# Patient Record
Sex: Male | Born: 1965
Health system: Southern US, Community
[De-identification: ages and names within clinical notes are randomized; demographics above are authoritative.]

## PROBLEM LIST (undated history)

## (undated) DIAGNOSIS — R7989 Other specified abnormal findings of blood chemistry: Secondary | ICD-10-CM

## (undated) DIAGNOSIS — K5792 Diverticulitis of intestine, part unspecified, without perforation or abscess without bleeding: Secondary | ICD-10-CM

---

## 1998-09-12 ENCOUNTER — Ambulatory Visit (HOSPITAL_BASED_OUTPATIENT_CLINIC_OR_DEPARTMENT_OTHER): Admission: RE | Admit: 1998-09-12 | Discharge: 1998-09-12 | Payer: Self-pay | Admitting: Surgery

## 1999-10-30 ENCOUNTER — Emergency Department (HOSPITAL_COMMUNITY): Admission: EM | Admit: 1999-10-30 | Discharge: 1999-10-30 | Payer: Self-pay | Admitting: *Deleted

## 1999-10-30 ENCOUNTER — Encounter: Payer: Self-pay | Admitting: Gastroenterology

## 2003-01-04 ENCOUNTER — Ambulatory Visit (HOSPITAL_COMMUNITY): Admission: RE | Admit: 2003-01-04 | Discharge: 2003-01-04 | Payer: Self-pay | Admitting: Internal Medicine

## 2003-01-04 ENCOUNTER — Encounter: Payer: Self-pay | Admitting: Internal Medicine

## 2004-04-27 ENCOUNTER — Encounter (HOSPITAL_COMMUNITY): Admission: RE | Admit: 2004-04-27 | Discharge: 2004-05-27 | Payer: Self-pay | Admitting: Internal Medicine

## 2005-11-21 ENCOUNTER — Ambulatory Visit: Payer: Self-pay | Admitting: Orthopedic Surgery

## 2005-11-22 ENCOUNTER — Encounter: Payer: Self-pay | Admitting: Orthopedic Surgery

## 2006-02-23 ENCOUNTER — Observation Stay (HOSPITAL_COMMUNITY): Admission: EM | Admit: 2006-02-23 | Discharge: 2006-02-25 | Payer: Self-pay | Admitting: Emergency Medicine

## 2006-10-30 ENCOUNTER — Ambulatory Visit: Payer: Self-pay | Admitting: Orthopedic Surgery

## 2006-12-04 ENCOUNTER — Ambulatory Visit: Payer: Self-pay | Admitting: Orthopedic Surgery

## 2007-01-21 ENCOUNTER — Ambulatory Visit (HOSPITAL_COMMUNITY): Admission: RE | Admit: 2007-01-21 | Discharge: 2007-01-21 | Payer: Self-pay | Admitting: Internal Medicine

## 2007-03-30 ENCOUNTER — Ambulatory Visit: Payer: Self-pay | Admitting: Orthopedic Surgery

## 2007-08-27 ENCOUNTER — Ambulatory Visit: Payer: Self-pay | Admitting: Orthopedic Surgery

## 2007-08-27 DIAGNOSIS — M25549 Pain in joints of unspecified hand: Secondary | ICD-10-CM

## 2007-08-31 ENCOUNTER — Encounter: Payer: Self-pay | Admitting: Orthopedic Surgery

## 2007-09-01 ENCOUNTER — Telehealth: Payer: Self-pay | Admitting: Orthopedic Surgery

## 2008-10-19 ENCOUNTER — Inpatient Hospital Stay (HOSPITAL_COMMUNITY): Admission: EM | Admit: 2008-10-19 | Discharge: 2008-10-20 | Payer: Self-pay | Admitting: Emergency Medicine

## 2008-11-02 ENCOUNTER — Ambulatory Visit (HOSPITAL_COMMUNITY): Admission: RE | Admit: 2008-11-02 | Discharge: 2008-11-02 | Payer: Self-pay | Admitting: Internal Medicine

## 2009-03-14 ENCOUNTER — Ambulatory Visit (HOSPITAL_COMMUNITY): Admission: RE | Admit: 2009-03-14 | Discharge: 2009-03-14 | Payer: Self-pay | Admitting: General Surgery

## 2010-09-25 LAB — COMPREHENSIVE METABOLIC PANEL
ALT: 20 U/L (ref 0–53)
AST: 22 U/L (ref 0–37)
Albumin: 4.2 g/dL (ref 3.5–5.2)
Alkaline Phosphatase: 58 U/L (ref 39–117)
Potassium: 3.6 mEq/L (ref 3.5–5.1)
Sodium: 137 mEq/L (ref 135–145)
Total Protein: 6.9 g/dL (ref 6.0–8.3)

## 2010-09-25 LAB — URINALYSIS, ROUTINE W REFLEX MICROSCOPIC
Hgb urine dipstick: NEGATIVE
Protein, ur: NEGATIVE mg/dL
Specific Gravity, Urine: 1.005 (ref 1.005–1.030)
Urobilinogen, UA: 0.2 mg/dL (ref 0.0–1.0)

## 2010-09-25 LAB — CBC
Hemoglobin: 15 g/dL (ref 13.0–17.0)
Platelets: 111 10*3/uL — ABNORMAL LOW (ref 150–400)
RDW: 13.4 % (ref 11.5–15.5)

## 2010-09-25 LAB — DIFFERENTIAL
Basophils Relative: 0 % (ref 0–1)
Eosinophils Absolute: 0 10*3/uL (ref 0.0–0.7)
Lymphs Abs: 0.5 10*3/uL — ABNORMAL LOW (ref 0.7–4.0)
Monocytes Absolute: 0.6 10*3/uL (ref 0.1–1.0)
Monocytes Relative: 7 % (ref 3–12)

## 2010-10-30 NOTE — H&P (Signed)
[name], [name] NO.:  [id number]   MEDICAL RECORD NO.:  [id number]          PATIENT TYPE:  INP   LOCATION:  A319                          FACILITY:  [facility]   PHYSICIAN:  Kingsley Callander. Ouida Sills, MD       DATE OF BIRTH:  January 16, 1966   DATE OF ADMISSION:  DATE OF DISCHARGE:  LH                              HISTORY & PHYSICAL   CHIEF COMPLAINT:  Abdominal pain.   HISTORY OF PRESENT ILLNESS:  This patient is a 45 year old male who  presented to the emergency room after developing left lower abdominal  pain on the morning of admission.  He felt he may have had a gas buildup  and also may have been experiencing some symptoms of abdominal wall  strain after a vigorous physical workout the day before.  He had a small  bowel movement on the morning of admission.  He had not experienced  bleeding.  He had not vomited prior to presentation, but after he got to  the hospital, he did have an episode of nausea and vomiting.  He denied  fever.  The pain seemed to radiate into his flank.  He was evaluated and  treated in the emergency room.  He had a CT scan, which revealed  diverticulitis with a contained perforation.   PAST MEDICAL HISTORY:  1. Gastroenteritis with a possible Mallory-Weiss tear at the time of a      hospitalization in 2007.  2. Recent hip discomfort treated with intermittent indomethacin use      prescribed by Dr. Romeo Apple.  3. Insomnia.   MEDICATIONS:  1. Restoril 15 mg nightly p.r.n.  2. Indomethacin 50 mg p.r.n., he has taken approximately two doses in      the last week.  3. Fish oil.  4. MSM.   ALLERGIES:  None.   SOCIAL HISTORY:  He does not smoke.  He does not abuse alcohol.  He does  not use recreational drugs.   FAMILY HISTORY:  His mother required surgery for diverticulitis last  year.   REVIEW OF SYSTEMS:  Otherwise noncontributory.   PHYSICAL EXAMINATION:  VITAL SIGNS:  Temperature 98.4, pulse 76,  respirations 24, blood pressure 139/77.  GENERAL:  Alert and in no distress.  HEENT:  No scleral icterus.  Pharynx is normal.  NECK:  Supple with no JVD or thyromegaly.  LUNGS:  Clear.  HEART:  Regular with no murmurs.  ABDOMEN:  Nondistended, tender in the left lower quadrant.  No palpable  mass or hepatosplenomegaly.  No rebound, tenderness, or guarding.  EXTREMITIES:  Normal pulses.  No cyanosis, clubbing, or edema.  NEUROLOGIC:  Fully intact.  LYMPH NODES:  No enlargement.   LABORATORY DATA:  White count 8.4, hemoglobin 15, platelets 111,000, 87  neutrophils, 6 lymphocytes.  Sodium 137, potassium 3.6, bicarb 29,  glucose 105, BUN 10, creatinine 1, SGOT 22, albumin 4.2, calcium 9.3.  Urinalysis is negative.  The CT scan of the abdomen and pelvis reveals  diffuse inflammatory changes of the descending proximal sigmoid colon  compatible with diverticulitis.  There was focal extracolonic gas  collection compatible with a contained perforation.  There was diffuse  inflammatory change including free fluid layering in the left pericolic  gutter.   IMPRESSION:  1. Diverticulitis with contained perforation.  He has been started on      and will continue IV ciprofloxacin and metronidazole.  Intravenous      Dilaudid will be used for pain control.  He has been seen in      surgical consultation by Dr. Lovell Sheehan.  He does not appear to      require surgical intervention at this point.  His case has been      discussed with Dr. Lovell Sheehan.  2. Thrombocytopenia.  3. History of insomnia.      Kingsley Callander. Ouida Sills, MD  Electronically Signed     ROF/MEDQ  D:  10/19/2008  T:  10/20/2008  Job:  [id number]

## 2010-10-30 NOTE — Discharge Summary (Signed)
NAMEBRYLEN, [name] NO.:  [id number]   MEDICAL RECORD NO.:  [id number]          PATIENT TYPE:  INP   LOCATION:  A319                          FACILITY:  [facility]   PHYSICIAN:  Kingsley Callander. Ouida Sills, MD       DATE OF BIRTH:  07/19/1965   DATE OF ADMISSION:  10/18/2008  DATE OF DISCHARGE:  05/06/2010LH                               DISCHARGE SUMMARY   DISCHARGE DIAGNOSES:  1. Diverticulitis with a contained perforation.  2. Thrombocytopenia.  3. Insomnia.   HOSPITAL COURSE:  This patient is a 45 year old male who presented with  left lower quadrant pain.  His white count was 8.4.  His temperature was  98.4.  He was evaluated with a CT scan, which revealed diverticulitis  with a contained perforation.  He was treated with IV ciprofloxacin and  metronidazole.  He was seen in surgical consultation by Dr. Lovell Sheehan.  He  was not felt to require operative intervention at this point.   He remained afebrile.  He was able to eat.  He was able to have a bowel  movement.  He was discharged on the second hospital day and will have  follow up in my office in 1 week.   DISCHARGE MEDICATIONS:  1. Cipro 500 mg b.i.d. for 14 days.  2. Metronidazole 500 mg t.i.d. for 14 days.  3. Vicodin q.4 h. p.r.n.  4. Restoril 15 mg nightly p.r.n.      Kingsley Callander. Ouida Sills, MD  Electronically Signed     ROF/MEDQ  D:  10/24/2008  T:  10/24/2008  Job:  [id number]

## 2010-11-02 NOTE — Discharge Summary (Signed)
[name], CAPPELLA NO.:  [id number]   MEDICAL RECORD NO.:  [id number]          PATIENT TYPE:  OBV   LOCATION:  A213                          FACILITY:  [facility]   PHYSICIAN:  Osvaldo Shipper, MD     DATE OF BIRTH:  18-Oct-1965   DATE OF ADMISSION:  02/23/2006  DATE OF DISCHARGE:  09/11/2007LH                                 DISCHARGE SUMMARY   PRIMARY CARE PHYSICIAN:  Kingsley Callander. Ouida Sills, MD.   Please review H&P dictated at the time of admission for details regarding  patient's presenting illness.   DISCHARGE DIAGNOSES:  1. Acute gastroenteritis from food poisoning, improved.  2. Thrombocytopenia and leukopenia of unclear etiology.   BRIEF HOSPITAL COURSE:  Briefly, this is a 45 year old Caucasian male who  presented to the ED two days ago with complaints of severe vomiting and  abdominal pain followed by diarrhea.  Apparently patient had eaten a  pokeweek salad on the day of his symptoms and it was thought that most  likely patient had presentations  consistent with food poisoning.  Patient  was admitted to the hospital for further management.  Patient also reported  some pinkish material in his emesis as well as in his bowel movements.  Possible patient might have suffered Mallory-Weiss tear because of his  severe vomiting.  In any case, his hemoglobins were monitored.  Initially  his hemoglobin actually was 17.4 which came down to 13.8.  His  high  hemoglobin is likely secondary to dehydration.  However, on the day of  discharge, he did not have any episodes of bleeding per rectum or orally.  His symptoms slowly improved over the course of the next two days.  He was  also gently hydrated.  On the day of discharge, he was completely  asymptomatic.  His diarrhea also  had improved and he was considered stable  for discharge.  Patient also showed a willingness to go home.   During the course of this admission, patient was noted to have low platelet  count in the 100  to 130s range.  He was also found to have a white count of  2.6 and 3.2.  He also was found to have somewhat of a low lymphocyte and  neutrophil count.  Patient informed that he was on Valtrex for genital  herpes. Valtrex can cause neutropenia as well as thrombocytopenia, hence  this is likely the reason for these findings.  He informed me that he has  been tested for HIV recently and was negative.  I have asked him to repeat a  CBC in a week's time and then follow up  with his PMD if these findings  persist.  He may need to be referred to a hematologist if his neutropenia  and thrombocytopenia do not resolve.   DISCHARGE MEDICATIONS:  Phenergan 25 mg p.o. q.4-6h. p.r.n. nausea.  Otherwise patient informed me that he does not take any other medications  apart from the Valtrex and Allegra.   FOLLOW UP:  1. Dr. Ouida Sills in one to two weeks.  2. CBC in one week.  DIET:  Patient has been asked to take a full liquid diet for today and then  a bland diet for one day and then he may return to his regular diet if he  feels well.   OTHER INSTRUCTIONS:  He was told that if his symptoms recur, he needs to  seek attention immediately.   Time spent discharging the patient:      Osvaldo Shipper, MD  Electronically Signed     GK/MEDQ  D:  02/25/2006  T:  02/25/2006  Job:  [id number]   cc:   Kingsley Callander. Ouida Sills, MD  Fax: [phone]

## 2010-11-02 NOTE — Procedures (Signed)
NAMEVAIL, VUNCANNON NO.:  [id number]   MEDICAL RECORD NO.:  [id number]          PATIENT TYPE:  REC   LOCATION:                                FACILITY:  [facility]   PHYSICIAN:  Kingsley Callander. Ouida Sills, MD       DATE OF BIRTH:  [birth year]   DATE OF PROCEDURE:  04/27/2004  DATE OF DISCHARGE:                                    STRESS TEST   DESCRIPTION OF PROCEDURE:  Gwenevere Abbot underwent an exercise stress test to  evaluate symptoms of recent chest pain.  He exercised 12 minutes (completed  stage IV of the Bruce protocol) maintaining a maximum heart rate of 175 (97%  or the age-predicted maximum heart rate) at a work load of 12.8 mets and  discontinued exercise due to fatigue.  There were no symptoms of chest pain.  There were no arrhythmias.  There were no ST segment changes diagnostic of  ischemia.  His baseline electrocardiogram revealed sinus bradycardia at 57  beats per minute with poor R-wave progression.   IMPRESSION:  No evidence of exercise-induced ischemia.     Channing Mutters   ROF/MEDQ  D:  04/27/2004  T:  04/27/2004  Job:  [id number]

## 2010-11-02 NOTE — Group Therapy Note (Signed)
NAMEDEXTER, SIGNOR NO.:  [id number]   MEDICAL RECORD NO.:  [id number]          PATIENT TYPE:  OBV   LOCATION:  A213                          FACILITY:  [facility]   PHYSICIAN:  Margaretmary Dys, M.D.DATE OF BIRTH:  1966/03/08   DATE OF PROCEDURE:  02/24/2006  DATE OF DISCHARGE:                                   PROGRESS NOTE   SUBJECTIVE:  The patient was admitted yesterday with nausea and vomiting  secondary to poke salad poisoning. The patient continues to have some nausea  but no more vomiting. He has some diarrhea with crampy abdominal pain. He  has had about 4 or 5 episodes this morning. He denies any fevers or chills.   He does have some muscle aches too.   OBJECTIVE:  GENERAL:  Conscious, alert, comfort not in acute distress. The  patient is acutely ill looking.  VITAL SIGNS:  Blood pressure was 123/77, pulse of 80, respirations 22. Tmax  was 99 degrees Fahrenheit.  HEENT:  Normocephalic, atraumatic. Oral mucosa was dry. No exudates were  noted.  NECK:  Supple, no JVD or lymphadenopathy.  LUNGS:  Good air entry bilaterally.  HEART:  S1, S2 regular. No S3, S4, gallops or rubs.  ABDOMEN:  Soft, nontender, bowel sounds positive, no masses palpable.  EXTREMITIES:  No pitting pedal edema. No calf induration or tenderness was  noted.   LABORATORY/DIAGNOSTIC DATA:  White blood cell count is down to 2.6,  hemoglobin of 16.5, hematocrit 48.1, platelet count 121. Lymphocytes were  4%, neutrophils 77%. Sodium 137, potassium was 3.9, chloride of 100. CO2 23,  glucose 141, BUN of 19, creatinine was 1.1. Calcium is 8.4.   Mr. Ron is a 45 year old Caucasian male who ate poke salad yesterday.  Poke is a well known toxin which is fairly common in the [place]. The patient  is having symptoms of nausea, vomiting, abdominal pain and diarrhea. The  patient has remained hemodynamically stable with no evidence of sepsis. His  kidney function has also remained intact. I did  a PubMed search on poke  poisoning and did not really come up with much but the fact that it does  contain some histamine products and the patient may be having all this  diarrhea and these symptoms because of elevated histamine action.   PLAN:  Continue IV hydration. Will give him Phenergan as needed. I will  start him on antihistamines if his symptoms continue to worsen. Will  continue to monitor his kidney function. His initial coagulation  profile  was  unremarkable but I did not see anything that would suggest that the poison  from the plant may cause bleeding abnormalities. Will continue to monitor  him closely though. I have discussed the overall plan with him and he has  verbalized a full understanding.      Margaretmary Dys, M.D.  Electronically Signed     AM/MEDQ  D:  02/24/2006  T:  02/24/2006  Job:  [id number]

## 2011-10-14 ENCOUNTER — Other Ambulatory Visit: Payer: Self-pay | Admitting: Orthopedic Surgery

## 2011-10-16 ENCOUNTER — Encounter (HOSPITAL_COMMUNITY): Payer: Self-pay | Admitting: Pharmacy Technician

## 2011-10-17 ENCOUNTER — Encounter (HOSPITAL_COMMUNITY)
Admission: RE | Admit: 2011-10-17 | Discharge: 2011-10-17 | Disposition: A | Discharge status: Home / Self Care | Payer: BC Managed Care – PPO | Source: Ambulatory Visit | Attending: Orthopedic Surgery | Admitting: Orthopedic Surgery

## 2011-10-17 ENCOUNTER — Encounter (HOSPITAL_COMMUNITY): Payer: Self-pay

## 2011-10-17 DIAGNOSIS — R7989 Other specified abnormal findings of blood chemistry: Secondary | ICD-10-CM

## 2011-10-17 HISTORY — DX: Other specified abnormal findings of blood chemistry: R79.89

## 2011-10-17 HISTORY — PX: HERNIA REPAIR: SHX51

## 2011-10-17 HISTORY — PX: WISDOM TOOTH EXTRACTION: SHX21

## 2011-10-17 LAB — SURGICAL PCR SCREEN: Staphylococcus aureus: NEGATIVE

## 2011-10-17 NOTE — Pre-Procedure Instructions (Addendum)
10-17-11 No labs or diagnostic  testing required today. 10-21-11 1210 -Dr. Simonne Come states no antibiotic needed preop. [name]

## 2011-10-17 NOTE — Patient Instructions (Signed)
[address] Haw[address] Michel Aguila  10/17/2011   Your procedure is scheduled on:  5-7 -2013  Report to [facility] at   1:00 PM.  Call this number if you have problems the morning of surgery: [phone]   Remember:   Do not eat food:After Midnight.  May have clear liquids:up to 6 Hours before arrival. Nothing after : 0900  Clear liquids include soda, tea, black coffee, apple or grape juice, broth.  Take these medicines the morning of surgery with A SIP OF WATER: none   Do not wear jewelry, make-up or nail polish.  Do not wear lotions, powders, or perfumes. You may wear deodorant.  Do not shave 48 hours prior to surgery.(face and neck okay, no shaving of legs)  Do not bring valuables to the hospital.  Contacts, dentures or bridgework may not be worn into surgery.  Leave suitcase in the car. After surgery it may be brought to your room.  For patients admitted to the hospital, checkout time is 11:00 AM the day of discharge.   Patients discharged the day of surgery will not be allowed to drive home.  Name and phone number of your driver: spouse  Special Instructions: CHG Shower Use Special Wash: 1/2 bottle night before surgery and 1/2 bottle morning of surgery.(avoid face and genitals)   Please read over the following fact sheets that you were given: MRSA Information.

## 2011-10-22 ENCOUNTER — Encounter (HOSPITAL_COMMUNITY)
Admission: RE | Disposition: A | Discharge status: Home / Self Care | Payer: Self-pay | Source: Ambulatory Visit | Attending: Orthopedic Surgery

## 2011-10-22 ENCOUNTER — Ambulatory Visit (HOSPITAL_COMMUNITY): Payer: BC Managed Care – PPO | Admitting: Anesthesiology

## 2011-10-22 ENCOUNTER — Observation Stay (HOSPITAL_COMMUNITY)
Admission: RE | Admit: 2011-10-22 | Discharge: 2011-10-23 | Disposition: A | Discharge status: Home / Self Care | Payer: BC Managed Care – PPO | Source: Ambulatory Visit | Attending: Orthopedic Surgery | Admitting: Orthopedic Surgery

## 2011-10-22 ENCOUNTER — Encounter (HOSPITAL_COMMUNITY): Payer: Self-pay | Admitting: Anesthesiology

## 2011-10-22 ENCOUNTER — Encounter (HOSPITAL_COMMUNITY): Payer: Self-pay | Admitting: *Deleted

## 2011-10-22 DIAGNOSIS — M2010 Hallux valgus (acquired), unspecified foot: Secondary | ICD-10-CM | POA: Insufficient documentation

## 2011-10-22 DIAGNOSIS — M898X9 Other specified disorders of bone, unspecified site: Principal | ICD-10-CM | POA: Insufficient documentation

## 2011-10-22 DIAGNOSIS — M25549 Pain in joints of unspecified hand: Secondary | ICD-10-CM

## 2011-10-22 DIAGNOSIS — M21619 Bunion of unspecified foot: Secondary | ICD-10-CM | POA: Insufficient documentation

## 2011-10-22 DIAGNOSIS — Z01812 Encounter for preprocedural laboratory examination: Secondary | ICD-10-CM | POA: Insufficient documentation

## 2011-10-22 DIAGNOSIS — Q66219 Congenital metatarsus primus varus, unspecified foot: Secondary | ICD-10-CM | POA: Insufficient documentation

## 2011-10-22 HISTORY — PX: BUNIONECTOMY: SHX129

## 2011-10-22 HISTORY — PX: MASS EXCISION: SHX2000

## 2011-10-22 SURGERY — EXCISION MASS
Anesthesia: General | Site: Foot | Laterality: Right | Wound class: Clean

## 2011-10-22 MED ORDER — MORPHINE SULFATE 2 MG/ML IJ SOLN
1.0000 mg | INTRAMUSCULAR | Status: DC | PRN
  Administered 2011-10-22 – 2011-10-23 (×3): 1 mg via INTRAVENOUS
  Filled 2011-10-22 (×2): qty 1

## 2011-10-22 MED ORDER — OXYCODONE-ACETAMINOPHEN 5-325 MG PO TABS
1.0000 | ORAL_TABLET | ORAL | Status: DC | PRN
  Administered 2011-10-22 – 2011-10-23 (×4): 2 via ORAL
  Filled 2011-10-22 (×4): qty 2

## 2011-10-22 MED ORDER — LACTATED RINGERS IV SOLN
INTRAVENOUS | Status: DC
  Administered 2011-10-22: 1000 mL via INTRAVENOUS

## 2011-10-22 MED ORDER — TEMAZEPAM 15 MG PO CAPS: 15.0000 mg | ORAL_CAPSULE | Freq: Every evening | ORAL | Status: DC | PRN

## 2011-10-22 MED ORDER — LIDOCAINE HCL (CARDIAC) 20 MG/ML IV SOLN
INTRAVENOUS | Status: DC | PRN
  Administered 2011-10-22: 100 mg via INTRAVENOUS

## 2011-10-22 MED ORDER — BUPIVACAINE HCL (PF) 0.5 % IJ SOLN
INTRAMUSCULAR | Status: AC
  Filled 2011-10-22: qty 30

## 2011-10-22 MED ORDER — ACETAMINOPHEN 10 MG/ML IV SOLN
INTRAVENOUS | Status: AC
  Filled 2011-10-22: qty 100

## 2011-10-22 MED ORDER — ONDANSETRON HCL 4 MG/2ML IJ SOLN
INTRAMUSCULAR | Status: DC | PRN
  Administered 2011-10-22: 4 mg via INTRAVENOUS

## 2011-10-22 MED ORDER — ONDANSETRON HCL 4 MG PO TABS: 4.0000 mg | ORAL_TABLET | Freq: Four times a day (QID) | ORAL | Status: DC | PRN

## 2011-10-22 MED ORDER — BUPIVACAINE HCL (PF) 0.5 % IJ SOLN
INTRAMUSCULAR | Status: DC | PRN
  Administered 2011-10-22: 7 mL

## 2011-10-22 MED ORDER — GLYCOPYRROLATE 0.2 MG/ML IJ SOLN
INTRAMUSCULAR | Status: DC | PRN
  Administered 2011-10-22 (×2): 0.1 mg via INTRAVENOUS

## 2011-10-22 MED ORDER — PROPOFOL 10 MG/ML IV EMUL
INTRAVENOUS | Status: DC | PRN
  Administered 2011-10-22: 200 mg via INTRAVENOUS

## 2011-10-22 MED ORDER — HYDROMORPHONE HCL PF 1 MG/ML IJ SOLN
INTRAMUSCULAR | Status: DC | PRN
  Administered 2011-10-22: 0.5 mg via INTRAVENOUS
  Administered 2011-10-22: 1 mg via INTRAVENOUS
  Administered 2011-10-22: 0.5 mg via INTRAVENOUS

## 2011-10-22 MED ORDER — FENTANYL CITRATE 0.05 MG/ML IJ SOLN
INTRAMUSCULAR | Status: DC | PRN
  Administered 2011-10-22 (×3): 50 ug via INTRAVENOUS
  Administered 2011-10-22: 100 ug via INTRAVENOUS

## 2011-10-22 MED ORDER — HYDROMORPHONE HCL PF 1 MG/ML IJ SOLN
INTRAMUSCULAR | Status: AC
  Filled 2011-10-22: qty 1

## 2011-10-22 MED ORDER — LACTATED RINGERS IV SOLN: INTRAVENOUS | Status: DC

## 2011-10-22 MED ORDER — TESTOSTERONE CYPIONATE 100 MG/ML IM SOLN: 100.0000 mg | INTRAMUSCULAR | Status: DC

## 2011-10-22 MED ORDER — MORPHINE SULFATE 2 MG/ML IJ SOLN
INTRAMUSCULAR | Status: AC
  Filled 2011-10-22: qty 1

## 2011-10-22 MED ORDER — PROMETHAZINE HCL 25 MG/ML IJ SOLN: 6.2500 mg | INTRAMUSCULAR | Status: DC | PRN

## 2011-10-22 MED ORDER — POVIDONE-IODINE 7.5 % EX SOLN: Freq: Once | CUTANEOUS | Status: DC

## 2011-10-22 MED ORDER — METOCLOPRAMIDE HCL 10 MG PO TABS: 5.0000 mg | ORAL_TABLET | Freq: Three times a day (TID) | ORAL | Status: DC | PRN

## 2011-10-22 MED ORDER — IBUPROFEN 400 MG PO TABS
400.0000 mg | ORAL_TABLET | Freq: Four times a day (QID) | ORAL | Status: DC | PRN
  Filled 2011-10-22: qty 1

## 2011-10-22 MED ORDER — METOCLOPRAMIDE HCL 5 MG/ML IJ SOLN: 5.0000 mg | Freq: Three times a day (TID) | INTRAMUSCULAR | Status: DC | PRN

## 2011-10-22 MED ORDER — ONDANSETRON HCL 4 MG/2ML IJ SOLN: 4.0000 mg | Freq: Four times a day (QID) | INTRAMUSCULAR | Status: DC | PRN

## 2011-10-22 MED ORDER — ACETAMINOPHEN 10 MG/ML IV SOLN
INTRAVENOUS | Status: DC | PRN
  Administered 2011-10-22: 1000 mg via INTRAVENOUS

## 2011-10-22 MED ORDER — DEXTROSE-NACL 5-0.45 % IV SOLN
INTRAVENOUS | Status: DC
  Administered 2011-10-22 – 2011-10-23 (×2): via INTRAVENOUS

## 2011-10-22 MED ORDER — MIDAZOLAM HCL 5 MG/5ML IJ SOLN
INTRAMUSCULAR | Status: DC | PRN
  Administered 2011-10-22: 2 mg via INTRAVENOUS

## 2011-10-22 MED ORDER — HYDROMORPHONE HCL PF 1 MG/ML IJ SOLN
0.2500 mg | INTRAMUSCULAR | Status: DC | PRN
  Administered 2011-10-22: 0.5 mg via INTRAVENOUS

## 2011-10-22 MED ORDER — MEPERIDINE HCL 50 MG/ML IJ SOLN: 6.2500 mg | INTRAMUSCULAR | Status: DC | PRN

## 2011-10-22 SURGICAL SUPPLY — 53 items
3/0 VICRYL CT-2 NEEDLE IMPLANT
BAG SPEC THK2 15X12 ZIP CLS (MISCELLANEOUS) ×1
BAG ZIPLOCK 12X15 (MISCELLANEOUS) ×2 IMPLANT
BANDAGE CONFORM 3  STR LF (GAUZE/BANDAGES/DRESSINGS) ×2 IMPLANT
BLADE OSCILLATING/SAGITTAL (BLADE) ×2
BLADE SURG 15 STRL LF DISP TIS (BLADE) ×2 IMPLANT
BLADE SURG 15 STRL SS (BLADE) ×6
BLADE SURG SZ10 CARB STEEL (BLADE) ×1 IMPLANT
BLADE SW THK.38XMED LNG THN (BLADE) ×1 IMPLANT
BNDG COHESIVE 3X5 TAN STRL LF (GAUZE/BANDAGES/DRESSINGS) ×2 IMPLANT
CLOTH BEACON ORANGE TIMEOUT ST (SAFETY) ×2 IMPLANT
CUFF TOURN SGL QUICK 34 (TOURNIQUET CUFF) ×2
CUFF TRNQT CYL 34X4X40X1 (TOURNIQUET CUFF) ×1 IMPLANT
DRSG EMULSION OIL 3X3 NADH (GAUZE/BANDAGES/DRESSINGS) ×2 IMPLANT
DURAPREP 26ML APPLICATOR (WOUND CARE) ×2 IMPLANT
ELECT REM PT RETURN 9FT ADLT (ELECTROSURGICAL) ×2
ELECTRODE REM PT RTRN 9FT ADLT (ELECTROSURGICAL) ×1 IMPLANT
GAUZE SPONGE 2X2 8PLY STRL LF (GAUZE/BANDAGES/DRESSINGS) ×1 IMPLANT
GAUZE SPONGE 4X4 16PLY XRAY LF (GAUZE/BANDAGES/DRESSINGS) ×1 IMPLANT
GLOVE BIO SURGEON STRL SZ7.5 (GLOVE) ×2 IMPLANT
GLOVE BIO SURGEON STRL SZ8 (GLOVE) ×4 IMPLANT
GLOVE ECLIPSE 8.0 STRL XLNG CF (GLOVE) ×2 IMPLANT
GLOVE INDICATOR 8.0 STRL GRN (GLOVE) ×4 IMPLANT
K-WIRE CAPS BLUE STERILE .035 (WIRE)
K-WIRE CAPS STERILE WHITE .045 (WIRE) IMPLANT
K-WIRE CAPS STERILE YELLOW .02 (WIRE)
K-WIRE SMTH SNGL TROCAR .028X4 (WIRE)
KIT BASIN OR (CUSTOM PROCEDURE TRAY) ×2 IMPLANT
KWIRE 4.0 X .035IN (WIRE) IMPLANT
KWIRE 4.0 X .045IN (WIRE) IMPLANT
KWIRE 4.0 X .062IN (WIRE) IMPLANT
KWIRE CAPS BLUE STRL .035 (WIRE) IMPLANT
KWIRE CAPS STRL YELLOW .02 (WIRE) IMPLANT
KWIRE SMTH SNGL TROCAR .028X4 (WIRE) IMPLANT
MANIFOLD NEPTUNE II (INSTRUMENTS) ×2 IMPLANT
NS IRRIG 1000ML POUR BTL (IV SOLUTION) ×2 IMPLANT
PACK LOWER EXTREMITY WL (CUSTOM PROCEDURE TRAY) ×2 IMPLANT
PAD CAST 3X4 CTTN HI CHSV (CAST SUPPLIES) IMPLANT
PAD CAST 4YDX4 CTTN HI CHSV (CAST SUPPLIES) ×1 IMPLANT
PADDING CAST ABS 3INX4YD NS (CAST SUPPLIES) ×1
PADDING CAST ABS COTTON 3X4 (CAST SUPPLIES) IMPLANT
PADDING CAST COTTON 3X4 STRL (CAST SUPPLIES) ×2
PADDING CAST COTTON 4X4 STRL (CAST SUPPLIES)
PIN CAPS ORTHO GREEN .062 (PIN) IMPLANT
POSITIONER SURGICAL ARM (MISCELLANEOUS) ×2 IMPLANT
SPONGE GAUZE 2X2 STER 10/PKG (GAUZE/BANDAGES/DRESSINGS)
SPONGE GAUZE 4X4 12PLY (GAUZE/BANDAGES/DRESSINGS) ×2 IMPLANT
SUT BONE WAX W31G (SUTURE) ×1 IMPLANT
SUT ETHILON 4 0 PS 2 18 (SUTURE) ×2 IMPLANT
SUT VIC AB 3-0 CT2 27 (SUTURE) ×1 IMPLANT
SUT VIC AB 4-0 PS1 27 (SUTURE) ×2 IMPLANT
SUT VICRYL 0 27 CT2 27 ABS (SUTURE) ×3 IMPLANT
WATER STERILE IRR 1500ML POUR (IV SOLUTION) ×1 IMPLANT

## 2011-10-22 NOTE — Anesthesia Postprocedure Evaluation (Signed)
  Anesthesia Post-op Note  Patient: [name]  Procedure(s) Performed: Procedure(s) (LRB): EXCISION MASS (Right) BUNIONECTOMY (Right)  Patient Location: PACU  Anesthesia Type: General  Level of Consciousness: awake and alert   Airway and Oxygen Therapy: Patient Spontanous Breathing  Post-op Pain: mild  Post-op Assessment: Post-op Vital signs reviewed, Patient's Cardiovascular Status Stable, Respiratory Function Stable, Patent Airway and No signs of Nausea or vomiting  Post-op Vital Signs: stable  Complications: No apparent anesthesia complications

## 2011-10-22 NOTE — H&P (Signed)
[name] is an 45 y.o. male.   Chief Complaint: painful rt heel and bunion HPIchronic progressive rt heel pain ;hallux valgus with metatarsus primus varus  Past Medical History  Diagnosis Date  . Low serum testosterone level 10-17-11    tx. injections weekly    Past Surgical History  Procedure Date  . Hernia repair 10-17-11    '00- [facility]  . Wisdom tooth extraction 10-17-11    extracted as teenager    History reviewed. No pertinent family history. Social History:  reports that he quit smoking about 11 years ago. He does not have any smokeless tobacco history on file. He reports that he drinks alcohol. He reports that he uses illicit drugs.  Allergies: No Known Allergies  Medications Prior to Admission  Medication Sig Dispense Refill  . ibuprofen (ADVIL,MOTRIN) 200 MG tablet Take 400 mg by mouth every 6 (six) hours as needed. For body aches      . temazepam (RESTORIL) 15 MG capsule Take 15 mg by mouth at bedtime as needed. For sleep      . testosterone cypionate (DEPOTESTOTERONE CYPIONATE) 100 MG/ML injection Inject 100 mg into the muscle every 7 (seven) days. For IM use only        No results found for this or any previous visit (from the past 48 hour(s)). No results found.  ROS  There were no vitals taken for this visit. Physical Exam  Constitutional: He is oriented to person, place, and time. He appears well-developed and well-nourished.  HENT:  Head: Normocephalic and atraumatic.  Right Ear: External ear normal.  Left Ear: External ear normal.  Nose: Nose normal.  Mouth/Throat: Oropharynx is clear and moist.  Eyes: Conjunctivae and EOM are normal. Pupils are equal, round, and reactive to light.  Neck: Normal range of motion. Neck supple.  Cardiovascular: Normal rate, regular rhythm, normal heart sounds and intact distal pulses.   Respiratory: Effort normal and breath sounds normal.  GI: Soft. Bowel sounds are normal.  Musculoskeletal: Normal range of motion.    Neurological: He is alert and oriented to person, place, and time. He has normal reflexes.  Skin: Skin is warm and dry.  Psychiatric: He has a normal mood and affect. His behavior is normal. Judgment and thought content normal.     Assessment/Plan 1.painful rt heel pump bump--excision  2.painful bunion with metatarsus primus varus--for Funk bunionectomy  Algie Westry P 10/22/2011, 2:27 PM

## 2011-10-22 NOTE — Transfer of Care (Signed)
Immediate Anesthesia Transfer of Care Note  Patient: [name]  Procedure(s) Performed: Procedure(s) (LRB): EXCISION MASS (Right) BUNIONECTOMY (Right)  Patient Location: PACU  Anesthesia Type: General  Level of Consciousness: sedated, patient cooperative and responds to stimulaton  Airway & Oxygen Therapy: Patient Spontanous Breathing and Patient connected to face mask oxgen  Post-op Assessment: Report given to PACU RN and Post -op Vital signs reviewed and stable  Post vital signs: Reviewed and stable  Complications: No apparent anesthesia complications

## 2011-10-22 NOTE — Anesthesia Preprocedure Evaluation (Addendum)

## 2011-10-22 NOTE — Brief Op Note (Signed)
10/22/2011  4:54 PM  PATIENT:  [name]  46 y.o. male  PRE-OPERATIVE DIAGNOSIS:  Right Foot Pump Bump with Bunion  And metatarsus  Primus varus  POST-OPERATIVE DIAGNO}SIS: same  PROCEDURE:  Procedure(s) (LRB): EXCISION MASS (pump bump)Right heel Funk bunionectomy right foot  SURGEON:  Surgeon(s) and Role:    * Drucilla Schmidt, MD - Primary  PHYSICIAN ASSISTANT: none  ASSISTANTS: nurse   ANESTHESIA:   general  EBL:  Total I/O In: 1000 [I.V.:1000] Out: -   BLOOD ADMINISTERED:none  DRAINS: none   LOCAL MEDICATIONS USED:  MARCAINE     SPECIMEN:  No Specimen  DISPOSITION OF SPECIMEN:  [id number]  COUNTS:  YES  TOURNIQUET:   Total Tourniquet Time Documented: Thigh (Right) - 70 minutes  DICTATION: .Other Dictation: Dictation Number [id number]  PLAN OF CARE: Admit for overnight observation  PATIENT DISPOSITION:  PACU - hemodynamically stable.   Delay start of Pharmacological VTE agent (>24hrs) due to surgical blood loss or risk of bleeding: not applicable

## 2011-10-23 MED ORDER — OXYCODONE-ACETAMINOPHEN 7.5-325 MG PO TABS: 1.0000 | ORAL_TABLET | ORAL | Status: AC | PRN

## 2011-10-23 MED ORDER — METHOCARBAMOL 500 MG PO TABS: 500.0000 mg | ORAL_TABLET | Freq: Four times a day (QID) | ORAL | Status: AC

## 2011-10-23 NOTE — Op Note (Signed)
NAMEARTH, NICASTRO NO.:  [id number]  MEDICAL RECORD NO.:  [id number]  LOCATION:  1608                         FACILITY:  [facility]  PHYSICIAN:  Marlowe Kays, M.D.  DATE OF BIRTH:  16-Apr-1966  DATE OF PROCEDURE:  10/22/2011 DATE OF DISCHARGE:                              OPERATIVE REPORT   PREOPERATIVE DIAGNOSES: 1. Painful pump bump, right heel. 2. Painful bunion with metatarsus primus varus and hallux valgus     deformities, right foot.  POSTOPERATIVE DIAGNOSES: 1. Painful pump bump, right heel. 2. Painful bunion with metatarsus primus varus and hallux valgus     deformities, right foot.  OPERATION: 1. Excision of pump bump, posterior right heel. 2. Funk bunionectomy right foot.  SURGEON:  Marlowe Kays, M.D.  ASSISTANT:  None.  ANESTHESIA:  General.  PATHOLOGY AND JUSTIFICATION FOR PROCEDURE:  The pump bump was painful in the posterior lateral heel.  He had a prominent bunion with some correctable hallux valgus deformity, but also slightly more than 15 degree for a second metatarsal angle.  Consequently I felt that metatarsal osteotomy would be the treatment of choice.  PROCEDURE:  After satisfactory general anesthesia, pneumatic tourniquet with the right leg Esmarched out non-sterilely and tourniquet inflated to 325 mmHg, the right leg was then prepped with DuraPrep from mid-calf to toes and draped in sterile field.  A time-out was performed.  I first made a curved incision posteriorly and laterally, carrying it down to the underlying bony prominence.  I dissected the periosteum off inferiorly and then excised the pump bump with a combination of osteotome and rongeur.  The wound was irrigated with sterile saline and bone wax was placed over the raw bone.  Soft tissues were infiltrated with 0.5% plain Marcaine.  Closure was then performed with interrupted 3- 0 Vicryl in the fascia and periosteum with interrupted 4-0 nylon mattress sutures in  the skin and subcutaneous tissue.  I then went to the forefoot and made a dorsomedial incision down to the capsule of the MP joint.  The dorsal sensory nerve and tendons were protected.  I then opened the capsule with a flap base distally.  He had a very prominent bunion, which I excised with a combination of osteotome and rongeur until it was nice and flush.  I then measured from the articular surface 1 cm proximal-ward and made a scribe line with Bovie on the raw bone at this location and then went another 6 mm proximally and made a second scribe line.  Protecting the overlying and underlying soft tissues with a baby Homans, I then used a microsaw to make first a cut perpendicular to the metatarsal at the distal mark  preserving the lateral cortex.  I then made a second angular cut again preserving the lateral cortex.  I carefully removed the wedge of bone with a small osteotome and then perforated the lateral cortex in multiple areas with a very tiny osteotome.  This allowed me to close down the osteotomy.  There did not appear to be any iatrogenic complication.  I packed small amounts of cancellous bone in the osteotomy site and then with the toe in a corrected position irrigated the  wound well with sterile saline, and again placed Marcaine in the soft tissues and then closed the wound holding the great toe in a neutral position and securing the flap around the perimeter with interrupted 0 Vicryl.  Skin and subcutaneous tissue were closed with interrupted 4-0 nylon mattress sutures.  I then placed Betadine, Adaptic over both wounds and a well-padded sterile tongue blade over the inner border of the great toe and foot, which I then incorporated in a soft dressing.  The tourniquet was then released.  He tolerated the procedure well and was taken to the recovery room in satisfactory condition with no known complications.          ______________________________ Marlowe Kays,  M.D.     JA/MEDQ  D:  10/22/2011  T:  10/23/2011  Job:  [id number]

## 2011-10-23 NOTE — Progress Notes (Signed)
Physical Therapy Treatment Patient Details Name: [name] MRN: [id number] DOB: 1966-06-06 Today's Date: 10/23/2011 Time: 4098-1191 PT Time Calculation (min): 17 min  PT Assessment / Plan / Recommendation Comments on Treatment Session  Pt doing well with mobility. Issued R postop shoe.  Unable to tolerate PWB on RLE due to pain so pt ambulated and performed stair training NWB RLE. Instructed pt to gradually start putting weight on RLE at home as tolerated, and to perform ankle ROM. REady to DC from PT standpoint.    Follow Up Recommendations  No PT follow up    Equipment Recommendations  None recommended by PT    Frequency 7X/week   Plan Discharge plan remains appropriate    Precautions / Restrictions Precautions Precautions: None Restrictions Weight Bearing Restrictions: Yes RLE Weight Bearing: Partial weight bearing Other Position/Activity Restrictions: WB status not in orders, MD has been phoned requesting WB status, will hold on ambulation until we have these orders   Pertinent Vitals/Pain *5/10 R foot, pain meds requested**    Mobility  Bed Mobility Bed Mobility: Supine to Sit Supine to Sit: 6: Modified independent (Device/Increase time);With rails;HOB flat Transfers Transfers: Sit to Stand Sit to Stand: 6: Modified independent (Device/Increase time);With upper extremity assist;With armrests;From bed Stand Pivot Transfers: 6: Modified independent (Device/Increase time);With armrests Details for Transfer Assistance: bed to recliner SPT using bedrail and armrests, NWB R (due to no WB status in orders yet) Ambulation/Gait Ambulation/Gait Assistance: 6: Modified independent (Device/Increase time) Ambulation Distance (Feet): 225 Feet Assistive device: Crutches Ambulation/Gait Assistance Details: NWB R due to pain with attempted PWB Gait Pattern: Step-through pattern Stairs: Yes Stairs Assistance: 5: Supervision Stairs Assistance Details (indicate cue type and  reason): for safety Stair Management Technique: No rails;Forwards Number of Stairs: 4     Exercises     PT Goals Acute Rehab PT Goals PT Goal Formulation: With patient Time For Goal Achievement: 10/23/11 Potential to Achieve Goals: Good Pt will Ambulate: >150 feet;with crutches;with modified independence PT Goal: Ambulate - Progress: Met Pt will Go Up / Down Stairs: 3-5 stairs;with supervision;with crutches PT Goal: Up/Down Stairs - Progress: Met  Visit Information  Last PT Received On: 10/23/11 Assistance Needed: +1    Subjective Data  Subjective: I'm ready to go home. 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

## 2011-10-23 NOTE — Care Management Note (Signed)
    Page 1 of 2   10/23/2011     1:02:38 PM   CARE MANAGEMENT NOTE 10/23/2011  Patient:  [name], [name]   Account Number:  [id number]  Date Initiated:  10/23/2011  Documentation initiated by:  Colleen Can  Subjective/Objective Assessment:   DX RT FOOT BUMP AND BUNION; EXCISION PUMP BUMP OF RT HEEL, BUNIONECTOMY     Action/Plan:   CM SPOKE WITH PATIENT. PLANS ARE FOR HOME TO [place] WHERE SPOUSE WILL BE CAREGIVER. ALREADY HAS CRUTCHES. PT IS NON WEIGHT BEARING. NO HH NEEDS   Anticipated DC Date:  10/23/2011   Anticipated DC Plan:  HOME/SELF CARE  In-house referral  NA      DC Planning Services  NA      PAC Choice  NA   Choice offered to / List presented to:  NA   DME arranged  NA      DME agency  NA     HH arranged  NA      HH agency  NA   Status of service:  Completed, signed off Medicare Important Message given?  NO (If response is "NO", the following Medicare IM given date fields will be blank) Date Medicare IM given:   Date Additional Medicare IM given:    Discharge Disposition:  HOME/SELF CARE  Per UR Regulation:  Reviewed for med. necessity/level of care/duration of stay  If discussed at Long Length of Stay Meetings, dates discussed:    Comments:

## 2011-10-23 NOTE — Evaluation (Signed)
Physical Therapy Evaluation Patient Details Name: JALIEL DEAVERS MRN: [id number] DOB: [birth year] Today's Date: 10/23/2011 Time: 1000-1015 PT Time Calculation (min): 15 min  PT Assessment / Plan / Recommendation Clinical Impression  46 y.o. male who is s/p bunionectomy and excision of "pump bump"  on RLE. Pt was independent and very active PTA. Expect pt will be able to DC home later today using crutches. Awaiting WB status orders.     PT Assessment  Patient needs continued PT services    Follow Up Recommendations  No PT follow up    Equipment Recommendations  None recommended by PT    Frequency 7X/week    Precautions / Restrictions Restrictions Other Position/Activity Restrictions: WB status not in orders, MD has been phoned requesting WB status, will hold on ambulation until we have these orders   Pertinent Vitals/Pain **2/10 pain*      Mobility  Bed Mobility Bed Mobility: Supine to Sit Supine to Sit: 6: Modified independent (Device/Increase time);With rails;HOB flat Transfers Transfers: Sit to Stand;Stand Pivot Transfers Sit to Stand: 6: Modified independent (Device/Increase time);With upper extremity assist;With armrests;From bed Stand Pivot Transfers: 6: Modified independent (Device/Increase time);With armrests Details for Transfer Assistance: bed to recliner SPT using bedrail and armrests, NWB R (due to no WB status in orders yet) Ambulation/Gait Ambulation/Gait Assistance Details: deferred until WB status in orders    Exercises     PT Goals Acute Rehab PT Goals PT Goal Formulation: With patient Time For Goal Achievement: 10/24/11 Potential to Achieve Goals: Good Pt will Ambulate: 51 - 150 feet;with crutches;with modified independence PT Goal: Ambulate - Progress: Goal set today Pt will Go Up / Down Stairs: 1-2 stairs;with crutches;with modified independence PT Goal: Up/Down Stairs - Progress: Goal set today  Visit Information  Last PT Received On:  10/23/11 Assistance Needed: +1    Subjective Data  Subjective: I was up in the chair earlier this morning. 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   Prior Functioning  Home Living Lives With: Spouse Available Help at Discharge: Family Home Access: Stairs to enter Secretary/administrator of Steps: 2 Entrance Stairs-Rails: None Home Layout: One level Home Adaptive Equipment: Crutches Prior Function Level of Independence: Independent Able to Take Stairs?: Yes Driving: Yes Vocation: Full time employment Comments: sitting, some standing Communication Communication: No difficulties    Cognition  Overall Cognitive Status: Appears within functional limits for tasks assessed/performed Arousal/Alertness: Awake/alert Orientation Level: Appears intact for tasks assessed Behavior During Session: [facility] for tasks performed    Extremity/Trunk Assessment Right Upper Extremity Assessment RUE ROM/Strength/Tone: [facility] for tasks assessed Left Upper Extremity Assessment LUE ROM/Strength/Tone: WFL for tasks assessed Right Lower Extremity Assessment RLE ROM/Strength/Tone: Deficits;WFL for tasks assessed RLE ROM/Strength/Tone Deficits: ankle not tested; hip/knee WNL for strength/ROM Left Lower Extremity Assessment LLE ROM/Strength/Tone: Within functional levels LLE Sensation: WFL - Light Touch LLE Coordination: WFL - gross/fine motor   Balance    End of Session PT - End of Session Activity Tolerance: Patient tolerated treatment well Patient left: in chair;with call bell/phone within reach Nurse Communication: Mobility status   Tamala Ser 10/23/2011, 10:31 AM  [phone]

## 2011-10-24 ENCOUNTER — Encounter (HOSPITAL_COMMUNITY): Payer: Self-pay | Admitting: Orthopedic Surgery

## 2011-11-18 NOTE — Discharge Summary (Signed)
NAMEGRANTLEY, SAVAGE NO.:  [id number]  MEDICAL RECORD NO.:  [id number]  LOCATION:  1608                         FACILITY:  [facility]  PHYSICIAN:  Marlowe Kays, M.D.  DATE OF BIRTH:  1965-12-27  DATE OF ADMISSION:  10/22/2011 DATE OF DISCHARGE:  10/23/2011                              DISCHARGE SUMMARY   ADMITTING DIAGNOSES: 1. Progressive right heel pain. 2. Hallux valgus with metatarsus primus varus.  OPERATIONS: 1. On Oct 22, 2011, the patient underwent excision of mass (pump bump),     right heel. 2. Alfred Levins bunionectomy, right foot.  BRIEF HISTORY:  The patient has had progressive and increasing pain both in the heel area, where it rubs the back of his shoe as well along the first metatarsal medially.  He has tried various shoes, which really have not helped him that much, and it was decided that we would go ahead with the above procedure, so that he could have more comfort with ambulation.  Risks and benefits of surgery were discussed with him prior to surgery.  COURSE IN THE HOSPITAL:  He tolerated the surgical procedure quite well. He was fitted with a wood shoe postop and allowed to ambulate.  He was afebrile.  Neurovascularly intact to the toes, and it was felt he could be maintained in his home environment and discharged to resume his home medications; ibuprofen 400 mg q.6 hours p.r.n., Restoril 15 mg at bedtime, Depo-Testosterone cypionate 100 mg per his family physician's orders.  He is to return to the office about 2 weeks after the date of surgery.  He is encouraged to elevate his foot.  No ice as necessary, but elevation is mandatory.  He is encouraged to call should he have any problems or questions.  At the time of discharge, all concerns were addressed prior to his discharge home.Pt. Is to con't with his home meds per his family MD.     Terie Purser L. Cherlynn June.   ______________________________ Marlowe Kays, M.D.    DLU/MEDQ  D:   11/18/2011  T:  11/18/2011  Job:  [id number]  cc:   [facility]

## 2013-07-06 ENCOUNTER — Ambulatory Visit (HOSPITAL_COMMUNITY)
Admission: RE | Admit: 2013-07-06 | Discharge: 2013-07-06 | Disposition: A | Discharge status: Home / Self Care | Payer: BC Managed Care – PPO | Source: Ambulatory Visit | Attending: Internal Medicine | Admitting: Internal Medicine

## 2013-07-06 ENCOUNTER — Other Ambulatory Visit (HOSPITAL_COMMUNITY): Payer: Self-pay | Admitting: Internal Medicine

## 2013-07-06 DIAGNOSIS — R51 Headache: Secondary | ICD-10-CM | POA: Insufficient documentation

## 2013-07-06 DIAGNOSIS — S0990XA Unspecified injury of head, initial encounter: Secondary | ICD-10-CM

## 2017-05-12 DIAGNOSIS — G4700 Insomnia, unspecified: Secondary | ICD-10-CM | POA: Diagnosis not present

## 2017-05-12 DIAGNOSIS — R7301 Impaired fasting glucose: Secondary | ICD-10-CM | POA: Diagnosis not present

## 2017-05-12 DIAGNOSIS — Z79899 Other long term (current) drug therapy: Secondary | ICD-10-CM | POA: Diagnosis not present

## 2017-05-12 DIAGNOSIS — Z125 Encounter for screening for malignant neoplasm of prostate: Secondary | ICD-10-CM | POA: Diagnosis not present

## 2017-05-12 DIAGNOSIS — E785 Hyperlipidemia, unspecified: Secondary | ICD-10-CM | POA: Diagnosis not present

## 2017-05-19 DIAGNOSIS — Z0001 Encounter for general adult medical examination with abnormal findings: Secondary | ICD-10-CM | POA: Diagnosis not present

## 2017-05-19 DIAGNOSIS — E785 Hyperlipidemia, unspecified: Secondary | ICD-10-CM | POA: Diagnosis not present

## 2017-05-19 DIAGNOSIS — Z23 Encounter for immunization: Secondary | ICD-10-CM | POA: Diagnosis not present

## 2017-05-19 DIAGNOSIS — G4700 Insomnia, unspecified: Secondary | ICD-10-CM | POA: Diagnosis not present

## 2017-07-09 DIAGNOSIS — M7918 Myalgia, other site: Secondary | ICD-10-CM | POA: Diagnosis not present

## 2017-07-09 DIAGNOSIS — M542 Cervicalgia: Secondary | ICD-10-CM | POA: Diagnosis not present

## 2017-07-09 DIAGNOSIS — M545 Low back pain: Secondary | ICD-10-CM | POA: Diagnosis not present

## 2017-07-29 DIAGNOSIS — M545 Low back pain: Secondary | ICD-10-CM | POA: Diagnosis not present

## 2017-07-29 DIAGNOSIS — M546 Pain in thoracic spine: Secondary | ICD-10-CM | POA: Diagnosis not present

## 2017-07-29 DIAGNOSIS — M7918 Myalgia, other site: Secondary | ICD-10-CM | POA: Diagnosis not present

## 2017-07-30 DIAGNOSIS — M546 Pain in thoracic spine: Secondary | ICD-10-CM | POA: Diagnosis not present

## 2017-07-30 DIAGNOSIS — M545 Low back pain: Secondary | ICD-10-CM | POA: Diagnosis not present

## 2017-07-30 DIAGNOSIS — M7918 Myalgia, other site: Secondary | ICD-10-CM | POA: Diagnosis not present

## 2017-07-31 DIAGNOSIS — M546 Pain in thoracic spine: Secondary | ICD-10-CM | POA: Diagnosis not present

## 2017-07-31 DIAGNOSIS — M545 Low back pain: Secondary | ICD-10-CM | POA: Diagnosis not present

## 2017-07-31 DIAGNOSIS — M7918 Myalgia, other site: Secondary | ICD-10-CM | POA: Diagnosis not present

## 2017-09-01 DIAGNOSIS — Z6826 Body mass index (BMI) 26.0-26.9, adult: Secondary | ICD-10-CM | POA: Diagnosis not present

## 2017-09-01 DIAGNOSIS — K5700 Diverticulitis of small intestine with perforation and abscess without bleeding: Secondary | ICD-10-CM | POA: Diagnosis not present

## 2017-09-02 DIAGNOSIS — L308 Other specified dermatitis: Secondary | ICD-10-CM | POA: Diagnosis not present

## 2017-09-02 DIAGNOSIS — D225 Melanocytic nevi of trunk: Secondary | ICD-10-CM | POA: Diagnosis not present

## 2017-09-02 DIAGNOSIS — L218 Other seborrheic dermatitis: Secondary | ICD-10-CM | POA: Diagnosis not present

## 2017-09-02 DIAGNOSIS — L918 Other hypertrophic disorders of the skin: Secondary | ICD-10-CM | POA: Diagnosis not present

## 2017-09-09 DIAGNOSIS — D225 Melanocytic nevi of trunk: Secondary | ICD-10-CM | POA: Diagnosis not present

## 2017-09-09 DIAGNOSIS — D485 Neoplasm of uncertain behavior of skin: Secondary | ICD-10-CM | POA: Diagnosis not present

## 2017-09-09 DIAGNOSIS — Z1283 Encounter for screening for malignant neoplasm of skin: Secondary | ICD-10-CM | POA: Diagnosis not present

## 2018-01-27 DIAGNOSIS — L308 Other specified dermatitis: Secondary | ICD-10-CM | POA: Diagnosis not present

## 2018-01-27 DIAGNOSIS — D225 Melanocytic nevi of trunk: Secondary | ICD-10-CM | POA: Diagnosis not present

## 2018-01-27 DIAGNOSIS — Z1283 Encounter for screening for malignant neoplasm of skin: Secondary | ICD-10-CM | POA: Diagnosis not present

## 2018-05-25 DIAGNOSIS — G4700 Insomnia, unspecified: Secondary | ICD-10-CM | POA: Diagnosis not present

## 2018-05-25 DIAGNOSIS — R030 Elevated blood-pressure reading, without diagnosis of hypertension: Secondary | ICD-10-CM | POA: Diagnosis not present

## 2018-05-25 DIAGNOSIS — E785 Hyperlipidemia, unspecified: Secondary | ICD-10-CM | POA: Diagnosis not present

## 2018-05-25 DIAGNOSIS — Z125 Encounter for screening for malignant neoplasm of prostate: Secondary | ICD-10-CM | POA: Diagnosis not present

## 2018-05-25 DIAGNOSIS — Z79899 Other long term (current) drug therapy: Secondary | ICD-10-CM | POA: Diagnosis not present

## 2018-06-01 DIAGNOSIS — Z6828 Body mass index (BMI) 28.0-28.9, adult: Secondary | ICD-10-CM | POA: Diagnosis not present

## 2018-06-01 DIAGNOSIS — Z0001 Encounter for general adult medical examination with abnormal findings: Secondary | ICD-10-CM | POA: Diagnosis not present

## 2018-06-01 DIAGNOSIS — Z23 Encounter for immunization: Secondary | ICD-10-CM | POA: Diagnosis not present

## 2018-06-01 DIAGNOSIS — E785 Hyperlipidemia, unspecified: Secondary | ICD-10-CM | POA: Diagnosis not present

## 2018-06-01 DIAGNOSIS — S65412A Laceration of blood vessel of left thumb, initial encounter: Secondary | ICD-10-CM | POA: Diagnosis not present

## 2018-11-10 DIAGNOSIS — M545 Low back pain: Secondary | ICD-10-CM | POA: Diagnosis not present

## 2018-11-10 DIAGNOSIS — M25511 Pain in right shoulder: Secondary | ICD-10-CM | POA: Diagnosis not present

## 2018-11-10 DIAGNOSIS — M7918 Myalgia, other site: Secondary | ICD-10-CM | POA: Diagnosis not present

## 2018-11-12 DIAGNOSIS — M7918 Myalgia, other site: Secondary | ICD-10-CM | POA: Diagnosis not present

## 2018-11-12 DIAGNOSIS — M25511 Pain in right shoulder: Secondary | ICD-10-CM | POA: Diagnosis not present

## 2018-11-12 DIAGNOSIS — M545 Low back pain: Secondary | ICD-10-CM | POA: Diagnosis not present

## 2018-11-16 DIAGNOSIS — M25511 Pain in right shoulder: Secondary | ICD-10-CM | POA: Diagnosis not present

## 2018-11-16 DIAGNOSIS — M7918 Myalgia, other site: Secondary | ICD-10-CM | POA: Diagnosis not present

## 2018-11-16 DIAGNOSIS — M545 Low back pain: Secondary | ICD-10-CM | POA: Diagnosis not present

## 2018-12-03 ENCOUNTER — Encounter: Payer: Self-pay | Admitting: Orthopaedic Surgery

## 2018-12-03 ENCOUNTER — Other Ambulatory Visit: Payer: Self-pay

## 2018-12-03 ENCOUNTER — Ambulatory Visit (INDEPENDENT_AMBULATORY_CARE_PROVIDER_SITE_OTHER): Payer: BC Managed Care – PPO

## 2018-12-03 ENCOUNTER — Ambulatory Visit: Payer: BC Managed Care – PPO | Admitting: Orthopaedic Surgery

## 2018-12-03 VITALS — BP 129/83 | HR 57 | Temp 97.4°F | Ht 71.0 in | Wt 197.0 lb

## 2018-12-03 DIAGNOSIS — G8929 Other chronic pain: Secondary | ICD-10-CM | POA: Diagnosis not present

## 2018-12-03 DIAGNOSIS — M545 Low back pain: Secondary | ICD-10-CM

## 2018-12-03 DIAGNOSIS — M25511 Pain in right shoulder: Secondary | ICD-10-CM

## 2018-12-03 MED ORDER — NAPROXEN 500 MG PO TABS: 500.0000 mg | ORAL_TABLET | Freq: Two times a day (BID) | ORAL | 5 refills | Status: DC

## 2018-12-03 NOTE — Progress Notes (Signed)
Subjective:    Patient ID: [name], male    DOB: 10/07/1965, 53 y.o.   MRN: [id number]  HPI He over six month history of right shoulder pain that began before Christmas of last year.  At first it was a nagging pain but now it bothers him with overhead use and sleeping on it at night.  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  He has no redness, no trauma, no swelling, no numbness.  He has tried some ice and heat some Advil with no help.  He has chronic lower back pain as well and is seeing a chiropractor for this.  He says his shoulder is much worse and wants that taken care of first.   Review of Systems  Constitutional: Positive for activity change.  Musculoskeletal: Positive for arthralgias and back pain.  All other systems reviewed and are negative.  For Review of Systems, all other systems reviewed and are negative.  The following is a summary of the past history medically, past history surgically, known current medicines, social history and family history.  This information is gathered electronically by the computer from prior information and documentation.  I review this each visit and have found including this information at this point in the chart is beneficial and informative.   Past Medical History:  Diagnosis Date  . Low serum testosterone level 10-17-11   tx. injections weekly    Past Surgical History:  Procedure Laterality Date  . BUNIONECTOMY  10/22/2011   Procedure: Lillard Anes;  Surgeon: Magnus Sinning, MD;  Location: [facility];  Service: Orthopedics;  Laterality: Right;  . HERNIA REPAIR  10-17-11   '00- RIH  . MASS EXCISION  10/22/2011   Procedure: EXCISION MASS;  Surgeon: Magnus Sinning, MD;  Location: [facility];  Service: Orthopedics;  Laterality: Right;  Excision of a Pump Bump Right Foot Frank Bunionectomy  . WISDOM TOOTH EXTRACTION  10-17-11   extracted as teenager    Current Outpatient Medications on File Prior to Visit  Medication  Sig Dispense Refill  . ibuprofen (ADVIL,MOTRIN) 200 MG tablet Take 400 mg by mouth every 6 (six) hours as needed. For body aches    . temazepam (RESTORIL) 15 MG capsule Take 15 mg by mouth at bedtime as needed. For sleep    . testosterone cypionate (DEPOTESTOTERONE CYPIONATE) 100 MG/ML injection Inject 100 mg into the muscle every 7 (seven) days. For IM use only     No current facility-administered medications on file prior to visit.     Social History   Socioeconomic History  . Marital status: Married    Spouse name: Not on file  . Number of children: Not on file  . Years of education: Not on file  . Highest education level: Not on file  Occupational History  . Not on file  Social Needs  . Financial resource strain: Not on file  . Food insecurity    Worry: Not on file    Inability: Not on file  . Transportation needs    Medical: Not on file    Non-medical: Not on file  Tobacco Use  . Smoking status: Former Smoker    Quit date: 10/16/2000    Years since quitting: 18.1  . Smokeless tobacco: Never Used  Substance and Sexual Activity  . Alcohol use: Yes    Comment: 2-3 beers or dks daily  . Drug use: Yes    Comment: occ. rare marijuana  .  Sexual activity: Yes  Lifestyle  . Physical activity    Days per week: Not on file    Minutes per session: Not on file  . Stress: Not on file  Relationships  . Social Musicianconnections    Talks on phone: Not on file    Gets together: Not on file    Attends religious service: Not on file    Active member of club or organization: Not on file    Attends meetings of clubs or organizations: Not on file    Relationship status: Not on file  . Intimate partner violence    Fear of current or ex partner: Not on file    Emotionally abused: Not on file    Physically abused: Not on file    Forced sexual activity: Not on file  Other Topics Concern  . Not on file  Social History Narrative  . Not on file    Family History  Problem Relation Age of  Onset  . Diabetes Mother        borderline  . Atrial fibrillation Father   . Diabetes Maternal Grandmother     BP 129/83   Pulse (!) 57   Temp (!) 97.4 F (36.3 C)   Ht 5\' 11"  (1.803 m)   Wt 197 lb (89.4 kg)   BMI 27.48 kg/m   Body mass index is 27.48 kg/m.     Objective:   Physical Exam Vitals signs reviewed.  Constitutional:      Appearance: He is well-developed.  HENT:     Head: Normocephalic and atraumatic.  Eyes:     Conjunctiva/sclera: Conjunctivae normal.     Pupils: Pupils are equal, round, and reactive to light.  Neck:     Musculoskeletal: Normal range of motion and neck supple.  Cardiovascular:     Rate and Rhythm: Normal rate and regular rhythm.  Pulmonary:     Effort: Pulmonary effort is normal.  Abdominal:     Palpations: Abdomen is soft.  Musculoskeletal:     Right shoulder: He exhibits decreased range of motion and tenderness.       Arms:  Skin:    General: Skin is warm and dry.  Neurological:     Mental Status: He is alert and oriented to person, place, and time.     Cranial Nerves: No cranial nerve deficit.     Motor: No abnormal muscle tone.     Coordination: Coordination normal.     Deep Tendon Reflexes: Reflexes are normal and symmetric. Reflexes normal.  Psychiatric:        Behavior: Behavior normal.        Thought Content: Thought content normal.        Judgment: Judgment normal.   x-rays were done of the right shoulder, reported separately.        Assessment & Plan:   Encounter Diagnoses  Name Primary?  . Pain in joint of right shoulder Yes  . Chronic midline low back pain without sciatica    PROCEDURE NOTE:  The patient request injection, verbal consent was obtained.  The right shoulder was prepped appropriately after time out was performed.   Sterile technique was observed and injection of 1 cc of Depo-Medrol 40 mg with several cc's of plain xylocaine. Anesthesia was provided by ethyl chloride and a 20-gauge needle was  used to inject the shoulder area. A posterior approach was used.  The injection was tolerated well.  A band aid dressing was applied.  The patient  was advised to apply ice later today and tomorrow to the injection sight as needed.  I will begin Naprosyn 500 po bid pc.  I will see him in two weeks.  If not improved, consider MRI.  Call if any problem.  Precautions discussed.   Electronically Signed Darreld McleanWayne Dafna Romo, MD 6/18/20208:57 AM

## 2018-12-17 ENCOUNTER — Ambulatory Visit: Payer: BC Managed Care – PPO | Admitting: Orthopaedic Surgery

## 2018-12-17 ENCOUNTER — Encounter: Payer: Self-pay | Admitting: Orthopaedic Surgery

## 2018-12-17 ENCOUNTER — Other Ambulatory Visit: Payer: Self-pay

## 2018-12-17 VITALS — BP 135/92 | HR 74 | Temp 98.2°F | Ht 71.0 in | Wt 197.0 lb

## 2018-12-17 DIAGNOSIS — M25511 Pain in right shoulder: Secondary | ICD-10-CM

## 2018-12-17 NOTE — Progress Notes (Signed)
Patient [name], male DOB:07-26-1965, 53 y.o. KGU:[id number]  Chief Complaint  Patient presents with  . Shoulder Pain    right     HPI  [name] is a 53 y.o. male who has right shoulder pain. He is improved since the injection last time. He has some tenderness now and not as much pain.  He can sleep better.  He is doing his exercises.  He is taking his Naprosyn.  He has no new trauma, no numbness.   Body mass index is 27.48 kg/m.  ROS  Review of Systems  Constitutional: Positive for activity change.  Musculoskeletal: Positive for arthralgias and back pain.  All other systems reviewed and are negative.   All other systems reviewed and are negative.  The following is a summary of the past history medically, past history surgically, known current medicines, social history and family history.  This information is gathered electronically by the computer from prior information and documentation.  I review this each visit and have found including this information at this point in the chart is beneficial and informative.    Past Medical History:  Diagnosis Date  . Low serum testosterone level 10-17-11   tx. injections weekly    Past Surgical History:  Procedure Laterality Date  . BUNIONECTOMY  10/22/2011   Procedure: Lillard Anes;  Surgeon: Magnus Sinning, MD;  Location: [facility];  Service: Orthopedics;  Laterality: Right;  . HERNIA REPAIR  10-17-11   '00- RIH  . MASS EXCISION  10/22/2011   Procedure: EXCISION MASS;  Surgeon: Magnus Sinning, MD;  Location: [facility];  Service: Orthopedics;  Laterality: Right;  Excision of a Pump Bump Right Foot Frank Bunionectomy  . WISDOM TOOTH EXTRACTION  10-17-11   extracted as teenager    Family History  Problem Relation Age of Onset  . Diabetes Mother        borderline  . Atrial fibrillation Father   . Diabetes Maternal Grandmother     Social History Social History   Tobacco Use  . Smoking status: Former Smoker    Quit  date: 10/16/2000    Years since quitting: 18.1  . Smokeless tobacco: Never Used  Substance Use Topics  . Alcohol use: Yes    Comment: 2-3 beers or dks daily  . Drug use: Yes    Comment: occ. rare marijuana    No Known Allergies  Current Outpatient Medications  Medication Sig Dispense Refill  . ibuprofen (ADVIL,MOTRIN) 200 MG tablet Take 400 mg by mouth every 6 (six) hours as needed. For body aches    . naproxen (NAPROSYN) 500 MG tablet Take 1 tablet (500 mg total) by mouth 2 (two) times daily with a meal. 60 tablet 5  . temazepam (RESTORIL) 15 MG capsule Take 15 mg by mouth at bedtime as needed. For sleep    . testosterone cypionate (DEPOTESTOTERONE CYPIONATE) 100 MG/ML injection Inject 100 mg into the muscle every 7 (seven) days. For IM use only     No current facility-administered medications for this visit.      Physical Exam  Blood pressure (!) 135/92, pulse 74, temperature 98.2 F (36.8 C), height 5\' 11"  (1.803 m), weight 197 lb (89.4 kg).  Constitutional: overall normal hygiene, normal nutrition, well developed, normal grooming, normal body habitus. Assistive device:none  Musculoskeletal: gait and station Limp none, muscle tone and strength are normal, no tremors or atrophy is present.  .  Neurological: coordination overall normal.  Deep tendon reflex/nerve stretch intact.  Sensation normal.  Cranial nerves II-XII intact.   Skin:   Normal overall no scars, lesions, ulcers or rashes. No psoriasis.  Psychiatric: Alert and oriented x 3.  Recent memory intact, remote memory unclear.  Normal mood and affect. Well groomed.  Good eye contact.  Cardiovascular: overall no swelling, no varicosities, no edema bilaterally, normal temperatures of the legs and arms, no clubbing, cyanosis and good capillary refill.  Lymphatic: palpation is normal.  Right shoulder has full ROM with tenderness in the extremes. All other systems reviewed and are negative   The patient has been  educated about the nature of the problem(s) and counseled on treatment options.  The patient appeared to understand what I have discussed and is in agreement with it.  Encounter Diagnosis  Name Primary?  . Pain in joint of right shoulder Yes    PLAN Call if any problems.  Precautions discussed.  Continue current medications.   Return to clinic 1 month   Electronically Signed Darreld McleanWayne Consetta Cosner, MD 7/2/20208:21 AM

## 2019-01-15 ENCOUNTER — Other Ambulatory Visit: Payer: Self-pay

## 2019-01-15 DIAGNOSIS — Z20822 Contact with and (suspected) exposure to covid-19: Secondary | ICD-10-CM

## 2019-01-17 LAB — NOVEL CORONAVIRUS, NAA: SARS-CoV-2, NAA: NOT DETECTED

## 2019-01-19 ENCOUNTER — Ambulatory Visit: Payer: BC Managed Care – PPO | Admitting: Orthopaedic Surgery

## 2019-05-21 DIAGNOSIS — N529 Male erectile dysfunction, unspecified: Secondary | ICD-10-CM | POA: Diagnosis not present

## 2019-05-21 DIAGNOSIS — Z79899 Other long term (current) drug therapy: Secondary | ICD-10-CM | POA: Diagnosis not present

## 2019-05-21 DIAGNOSIS — E785 Hyperlipidemia, unspecified: Secondary | ICD-10-CM | POA: Diagnosis not present

## 2019-05-21 DIAGNOSIS — Z125 Encounter for screening for malignant neoplasm of prostate: Secondary | ICD-10-CM | POA: Diagnosis not present

## 2019-05-21 DIAGNOSIS — G4700 Insomnia, unspecified: Secondary | ICD-10-CM | POA: Diagnosis not present

## 2019-06-07 DIAGNOSIS — N529 Male erectile dysfunction, unspecified: Secondary | ICD-10-CM | POA: Diagnosis not present

## 2019-06-07 DIAGNOSIS — D696 Thrombocytopenia, unspecified: Secondary | ICD-10-CM | POA: Diagnosis not present

## 2019-06-07 DIAGNOSIS — E785 Hyperlipidemia, unspecified: Secondary | ICD-10-CM | POA: Diagnosis not present

## 2019-07-21 DIAGNOSIS — Z1211 Encounter for screening for malignant neoplasm of colon: Secondary | ICD-10-CM | POA: Diagnosis not present

## 2019-07-21 DIAGNOSIS — D123 Benign neoplasm of transverse colon: Secondary | ICD-10-CM | POA: Diagnosis not present

## 2019-07-21 DIAGNOSIS — D125 Benign neoplasm of sigmoid colon: Secondary | ICD-10-CM | POA: Diagnosis not present

## 2019-07-21 DIAGNOSIS — K635 Polyp of colon: Secondary | ICD-10-CM | POA: Diagnosis not present

## 2019-08-26 DIAGNOSIS — Z23 Encounter for immunization: Secondary | ICD-10-CM | POA: Diagnosis not present

## 2019-09-25 DIAGNOSIS — Z23 Encounter for immunization: Secondary | ICD-10-CM | POA: Diagnosis not present

## 2019-11-01 DIAGNOSIS — Z79899 Other long term (current) drug therapy: Secondary | ICD-10-CM | POA: Diagnosis not present

## 2019-11-01 DIAGNOSIS — E785 Hyperlipidemia, unspecified: Secondary | ICD-10-CM | POA: Diagnosis not present

## 2019-11-04 DIAGNOSIS — E785 Hyperlipidemia, unspecified: Secondary | ICD-10-CM | POA: Diagnosis not present

## 2019-11-04 DIAGNOSIS — Z6828 Body mass index (BMI) 28.0-28.9, adult: Secondary | ICD-10-CM | POA: Diagnosis not present

## 2019-11-27 ENCOUNTER — Emergency Department (HOSPITAL_COMMUNITY)
Admission: EM | Admit: 2019-11-27 | Discharge: 2019-11-27 | Disposition: A | Discharge status: Home / Self Care | Payer: BC Managed Care – PPO | Attending: Emergency Medicine | Admitting: Emergency Medicine

## 2019-11-27 ENCOUNTER — Emergency Department (HOSPITAL_COMMUNITY): Payer: BC Managed Care – PPO

## 2019-11-27 ENCOUNTER — Encounter (HOSPITAL_COMMUNITY): Payer: Self-pay | Admitting: *Deleted

## 2019-11-27 ENCOUNTER — Other Ambulatory Visit: Payer: Self-pay

## 2019-11-27 DIAGNOSIS — R109 Unspecified abdominal pain: Secondary | ICD-10-CM | POA: Diagnosis not present

## 2019-11-27 DIAGNOSIS — Z87891 Personal history of nicotine dependence: Secondary | ICD-10-CM | POA: Insufficient documentation

## 2019-11-27 DIAGNOSIS — K5732 Diverticulitis of large intestine without perforation or abscess without bleeding: Secondary | ICD-10-CM | POA: Insufficient documentation

## 2019-11-27 DIAGNOSIS — K5792 Diverticulitis of intestine, part unspecified, without perforation or abscess without bleeding: Secondary | ICD-10-CM | POA: Diagnosis not present

## 2019-11-27 DIAGNOSIS — R1032 Left lower quadrant pain: Secondary | ICD-10-CM | POA: Diagnosis not present

## 2019-11-27 HISTORY — DX: Diverticulitis of intestine, part unspecified, without perforation or abscess without bleeding: K57.92

## 2019-11-27 LAB — COMPREHENSIVE METABOLIC PANEL
ALT: 36 U/L (ref 0–44)
AST: 21 U/L (ref 15–41)
Albumin: 4.6 g/dL (ref 3.5–5.0)
Alkaline Phosphatase: 84 U/L (ref 38–126)
Anion gap: 12 (ref 5–15)
BUN: 16 mg/dL (ref 6–20)
CO2: 22 mmol/L (ref 22–32)
Calcium: 8.9 mg/dL (ref 8.9–10.3)
Chloride: 102 mmol/L (ref 98–111)
Creatinine, Ser: 0.8 mg/dL (ref 0.61–1.24)
GFR calc Af Amer: >60 mL/min (ref >60)
GFR calc non Af Amer: >60 mL/min (ref >60)
Glucose, Bld: 102 mg/dL — ABNORMAL HIGH (ref 70–99)
Potassium: 3.6 mmol/L (ref 3.5–5.1)
Sodium: 136 mmol/L (ref 135–145)
Total Bilirubin: 0.4 mg/dL (ref 0.3–1.2)
Total Protein: 7.4 g/dL (ref 6.5–8.1)

## 2019-11-27 LAB — CBC
HCT: 40.3 % (ref 39.0–52.0)
Hemoglobin: 13.5 g/dL (ref 13.0–17.0)
MCH: 29.3 pg (ref 26.0–34.0)
MCHC: 33.5 g/dL (ref 30.0–36.0)
MCV: 87.4 fL (ref 80.0–100.0)
Platelets: 117 10*3/uL — ABNORMAL LOW (ref 150–400)
RBC: 4.61 MIL/uL (ref 4.22–5.81)
RDW: 12.5 % (ref 11.5–15.5)
WBC: 7.3 10*3/uL (ref 4.0–10.5)
nRBC: 0 % (ref 0.0–0.2)

## 2019-11-27 LAB — LIPASE, BLOOD: Lipase: 32 U/L (ref 11–51)

## 2019-11-27 MED ORDER — OXYCODONE HCL 5 MG PO TABS
5.0000 mg | ORAL_TABLET | Freq: Once | ORAL | Status: AC
  Administered 2019-11-27: 5 mg via ORAL
  Filled 2019-11-27: qty 1

## 2019-11-27 MED ORDER — OXYCODONE HCL 5 MG PO TABS: 5.0000 mg | ORAL_TABLET | ORAL | 0 refills | Status: DC | PRN

## 2019-11-27 MED ORDER — AMOXICILLIN-POT CLAVULANATE 875-125 MG PO TABS
1.0000 | ORAL_TABLET | Freq: Once | ORAL | Status: AC
  Administered 2019-11-27: 1 via ORAL
  Filled 2019-11-27: qty 1

## 2019-11-27 MED ORDER — IOHEXOL 300 MG/ML  SOLN
100.0000 mL | Freq: Once | INTRAMUSCULAR | Status: AC | PRN
  Administered 2019-11-27: 100 mL via INTRAVENOUS

## 2019-11-27 MED ORDER — SODIUM CHLORIDE 0.9 % IV BOLUS
1000.0000 mL | Freq: Once | INTRAVENOUS | Status: AC
  Administered 2019-11-27: 1000 mL via INTRAVENOUS

## 2019-11-27 MED ORDER — AMOXICILLIN-POT CLAVULANATE 875-125 MG PO TABS: 1.0000 | ORAL_TABLET | Freq: Two times a day (BID) | ORAL | 0 refills | Status: AC

## 2019-11-27 NOTE — ED Provider Notes (Signed)
[facility] Emergency Department Provider Note MRN:  [id number]  Arrival date & time: 11/27/19     Chief Complaint   Abdominal Pain   History of Present Illness   [name] is a 54 y.o. year-old male with a history of diverticulitis presenting to the ED with chief complaint of abdominal pain.  Location: Left lower quadrant and left flank Duration: 4-hour Onset: Gradual Timing: Cough Description: Sharp Severity: Moderate Exacerbating/Alleviating Factors: Worse with palpation Associated Symptoms: Feeling hot, recent constipation Pertinent Negatives: Denies nausea or vomiting, no diarrhea, no chest pain or shortness of breath   Review of Systems  A complete 10 system review of systems was obtained and all systems are negative except as noted in the HPI and PMH.   Patient's Health History    Past Medical History:  Diagnosis Date  . Diverticulitis   . Low serum testosterone level 10-17-11   tx. injections weekly    Past Surgical History:  Procedure Laterality Date  . BUNIONECTOMY  10/22/2011   Procedure: Lillard Anes;  Surgeon: Magnus Sinning, MD;  Location: [facility];  Service: Orthopedics;  Laterality: Right;  . HERNIA REPAIR  10-17-11   '00- RIH  . MASS EXCISION  10/22/2011   Procedure: EXCISION MASS;  Surgeon: Magnus Sinning, MD;  Location: [facility];  Service: Orthopedics;  Laterality: Right;  Excision of a Pump Bump Right Foot Frank Bunionectomy  . WISDOM TOOTH EXTRACTION  10-17-11   extracted as teenager    Family History  Problem Relation Age of Onset  . Diabetes Mother        borderline  . Atrial fibrillation Father   . Diabetes Maternal Grandmother     Social History   Socioeconomic History  . Marital status: Married    Spouse name: Not on file  . Number of children: Not on file  . Years of education: Not on file  . Highest education level: Not on file  Occupational History  . Not on file  Tobacco Use  . Smoking status: Former  Smoker    Quit date: 10/16/2000    Years since quitting: 19.1  . Smokeless tobacco: Never Used  Substance and Sexual Activity  . Alcohol use: Yes    Comment: 2-3 beers or dks daily  . Drug use: Yes    Comment: occ. rare marijuana  . Sexual activity: Yes  Other Topics Concern  . Not on file  Social History Narrative  . Not on file   Social Determinants of Health   Financial Resource Strain:   . Difficulty of Paying Living Expenses:   Food Insecurity:   . Worried About Charity fundraiser in the Last Year:   . Arboriculturist in the Last Year:   Transportation Needs:   . Film/video editor (Medical):   Marland Kitchen Lack of Transportation (Non-Medical):   Physical Activity:   . Days of Exercise per Week:   . Minutes of Exercise per Session:   Stress:   . Feeling of Stress :   Social Connections:   . Frequency of Communication with Friends and Family:   . Frequency of Social Gatherings with Friends and Family:   . Attends Religious Services:   . Active Member of Clubs or Organizations:   . Attends Archivist Meetings:   Marland Kitchen Marital Status:   Intimate Partner Violence:   . Fear of Current or Ex-Partner:   . Emotionally Abused:   Marland Kitchen Physically Abused:   .  Sexually Abused:      Physical Exam   Vitals:   11/27/19 2014  BP: (!) 143/87  Pulse: 70  Resp: 14  Temp: 98.4 F (36.9 C)  SpO2: 96%    CONSTITUTIONAL: Well-appearing, NAD NEURO:  Alert and oriented x 3, no focal deficits EYES:  eyes equal and reactive ENT/NECK:  no LAD, no JVD CARDIO: Rate rate, well-perfused, normal S1 and S2 PULM:  CTAB no wheezing or rhonchi GI/GU:  normal bowel sounds, non-distended, mild tenderness palpation to the left lower quadrant MSK/SPINE:  No gross deformities, no edema SKIN:  no rash, atraumatic PSYCH:  Appropriate speech and behavior  *Additional and/or pertinent findings included in MDM below  Diagnostic and Interventional Summary    EKG Interpretation  Date/Time:      Ventricular Rate:    PR Interval:    QRS Duration:   QT Interval:    QTC Calculation:   R Axis:     Text Interpretation:        Labs Reviewed  COMPREHENSIVE METABOLIC PANEL - Abnormal; Notable for the following components:      Result Value   Glucose, Bld 102 (*)    All other components within normal limits  CBC - Abnormal; Notable for the following components:   Platelets 117 (*)    All other components within normal limits  LIPASE, BLOOD    CT ABDOMEN PELVIS W CONTRAST  Final Result      Medications  oxyCODONE (Oxy IR/ROXICODONE) immediate release tablet 5 mg (has no administration in time range)  amoxicillin-clavulanate (AUGMENTIN) 875-125 MG per tablet 1 tablet (has no administration in time range)  sodium chloride 0.9 % bolus 1,000 mL (0 mLs Intravenous Stopped 11/27/19 2210)  iohexol (OMNIPAQUE) 300 MG/ML solution 100 mL (100 mLs Intravenous Contrast Given 11/27/19 2152)     Procedures  /  Critical Care Procedures  ED Course and Medical Decision Making  I have reviewed the triage vital signs, the nursing notes, and pertinent available records from the EMR.  Listed above are laboratory and imaging tests that I personally ordered, reviewed, and interpreted and then considered in my medical decision making (see below for details).      History of diverticulitis with perforation, will CT today to exclude complicated case of diverticulitis.  CT confirms diverticulitis without complicating features such as abscess or perforation.  Patient continues to look and feel well, vitals normal, no signs of systemic infection.  Providing first dose antibiotics here, appropriate for discharge.  Elmer Sow. Pilar Plate, MD [facility] Emergency Medicine [facility] [email or web address]  Final Clinical Impressions(s) / ED Diagnoses     ICD-10-CM   1. Diverticulitis  K57.92     ED Discharge Orders         Ordered    amoxicillin-clavulanate (AUGMENTIN) 875-125 MG  tablet  Every 12 hours     Discontinue  Reprint     11/27/19 2231    oxyCODONE (ROXICODONE) 5 MG immediate release tablet  Every 4 hours PRN     Discontinue  Reprint     11/27/19 2231           Discharge Instructions Discussed with and Provided to Patient:     Discharge Instructions     You were evaluated in the Emergency Department and after careful evaluation, we did not find any emergent condition requiring admission or further testing in the hospital.  Your exam/testing today was overall reassuring.  Your symptoms seem to be due to  diverticulitis.  The CT did not show any complicating features.  Please take the antibiotics as prescribed.  We recommend Tylenol and Motrin at home for discomfort.  We also recommend stool softener such as over-the-counter MiraLAX.  For more severe pain keeping you from sleeping you can use the oxycodone medication provided.  Please return to the Emergency Department if you experience any worsening of your condition.  We encourage you to follow up with a primary care provider.  Thank you for allowing [place] to be a part of your care.        Sabas Sous, MD 11/27/19 2233

## 2019-11-27 NOTE — Discharge Instructions (Addendum)
You were evaluated in the Emergency Department and after careful evaluation, we did not find any emergent condition requiring admission or further testing in the hospital.  Your exam/testing today was overall reassuring.  Your symptoms seem to be due to diverticulitis.  The CT did not show any complicating features.  Please take the antibiotics as prescribed.  We recommend Tylenol and Motrin at home for discomfort.  We also recommend stool softener such as over-the-counter MiraLAX.  For more severe pain keeping you from sleeping you can use the oxycodone medication provided.  Please return to the Emergency Department if you experience any worsening of your condition.  We encourage you to follow up with a primary care provider.  Thank you for allowing [place] to be a part of your care.

## 2019-11-27 NOTE — ED Triage Notes (Signed)
Pt with left sided abd pain, denies N/v/D.  LBM today and normal per pt.

## 2019-11-30 DIAGNOSIS — K5700 Diverticulitis of small intestine with perforation and abscess without bleeding: Secondary | ICD-10-CM | POA: Diagnosis not present

## 2020-03-20 ENCOUNTER — Ambulatory Visit
Admission: EM | Admit: 2020-03-20 | Discharge: 2020-03-20 | Disposition: A | Discharge status: Home / Self Care | Payer: BC Managed Care – PPO | Attending: Emergency Medicine | Admitting: Emergency Medicine

## 2020-03-20 ENCOUNTER — Other Ambulatory Visit: Payer: Self-pay

## 2020-03-20 DIAGNOSIS — K5792 Diverticulitis of intestine, part unspecified, without perforation or abscess without bleeding: Secondary | ICD-10-CM | POA: Diagnosis not present

## 2020-03-20 DIAGNOSIS — R1032 Left lower quadrant pain: Secondary | ICD-10-CM

## 2020-03-20 MED ORDER — CIPROFLOXACIN HCL 500 MG PO TABS
500.0000 mg | ORAL_TABLET | Freq: Two times a day (BID) | ORAL | 0 refills | Status: AC
Start: 2020-03-20 — End: 2020-03-27

## 2020-03-20 MED ORDER — METRONIDAZOLE 500 MG PO TABS
500.0000 mg | ORAL_TABLET | Freq: Three times a day (TID) | ORAL | 0 refills | Status: AC
Start: 2020-03-20 — End: 2020-03-27

## 2020-03-20 NOTE — ED Triage Notes (Signed)
Pt presents with left lower quadrant pain that began this morning , has h/o diverticulitis , some constipation

## 2020-03-20 NOTE — Discharge Instructions (Signed)
Ciprofloxacin was prescribed take as directed Metronidazole was prescribed take as directed Follow-up with PCP Use OTC MiraLAX for constipation If you experience new or worsening symptoms return or go to ER such as fever, chills, nausea, vomiting, diarrhea, bloody or dark tarry stools, constipation, urinary symptoms, worsening abdominal discomfort, symptoms that do not improve with medications, inability to keep fluids down, etc..Marland Kitchen

## 2020-03-20 NOTE — ED Provider Notes (Signed)
[facility]   [id number] 03/20/20 Arrival Time: 1118  Chief Complaint  Patient presents with  . Abdominal Pain     SUBJECTIVE:  [name] is a 54 y.o. male with history of diverticulitis presented to the urgent care for complaint of left lower quadrant pain that started this morning.  Denies a precipitating event, or specific injury.  Patient localizes pain to left lower quadrant.  Describes it as constant and achy in character. Has not tried any medication. Denies aggravating factors.  Reports similar symptoms in the past that resolved with Cipro and Flagyl.  Denies fever, chills, appetite change, weight change, chest pain, nausea, vomiting, changes in bowel or bladder habits.     No LMP for male patient.  ROS: As per HPI.  All other pertinent ROS negative.     Past Medical History:  Diagnosis Date  . Diverticulitis   . Low serum testosterone level 10-17-11   tx. injections weekly   Past Surgical History:  Procedure Laterality Date  . BUNIONECTOMY  10/22/2011   Procedure: Arbutus Leas;  Surgeon: Drucilla Schmidt, MD;  Location: [facility];  Service: Orthopedics;  Laterality: Right;  . HERNIA REPAIR  10-17-11   '00- RIH  . MASS EXCISION  10/22/2011   Procedure: EXCISION MASS;  Surgeon: Drucilla Schmidt, MD;  Location: [facility];  Service: Orthopedics;  Laterality: Right;  Excision of a Pump Bump Right Foot Frank Bunionectomy  . WISDOM TOOTH EXTRACTION  10-17-11   extracted as teenager   No Known Allergies No current facility-administered medications on file prior to encounter.   Current Outpatient Medications on File Prior to Encounter  Medication Sig Dispense Refill  . ibuprofen (ADVIL,MOTRIN) 200 MG tablet Take 400 mg by mouth every 6 (six) hours as needed. For body aches    . naproxen (NAPROSYN) 500 MG tablet Take 1 tablet (500 mg total) by mouth 2 (two) times daily with a meal. 60 tablet 5  . oxyCODONE (ROXICODONE) 5 MG immediate release tablet Take 1 tablet (5 mg  total) by mouth every 4 (four) hours as needed for severe pain. 5 tablet 0  . temazepam (RESTORIL) 15 MG capsule Take 15 mg by mouth at bedtime as needed. For sleep    . testosterone cypionate (DEPOTESTOTERONE CYPIONATE) 100 MG/ML injection Inject 100 mg into the muscle every 7 (seven) days. For IM use only     Social History   Socioeconomic History  . Marital status: Married    Spouse name: Not on file  . Number of children: Not on file  . Years of education: Not on file  . Highest education level: Not on file  Occupational History  . Not on file  Tobacco Use  . Smoking status: Former Smoker    Quit date: 10/16/2000    Years since quitting: 19.4  . Smokeless tobacco: Never Used  Substance and Sexual Activity  . Alcohol use: Yes    Comment: 2-3 beers or dks daily  . Drug use: Yes    Comment: occ. rare marijuana  . Sexual activity: Yes  Other Topics Concern  . Not on file  Social History Narrative  . Not on file   Social Determinants of Health   Financial Resource Strain:   . Difficulty of Paying Living Expenses: Not on file  Food Insecurity:   . Worried About Programme researcher, broadcasting/film/video in the Last Year: Not on file  . Ran Out of Food in the Last Year: Not on file  Transportation Needs:   . Freight forwarder (Medical): Not on file  . Lack of Transportation (Non-Medical): Not on file  Physical Activity:   . Days of Exercise per Week: Not on file  . Minutes of Exercise per Session: Not on file  Stress:   . Feeling of Stress : Not on file  Social Connections:   . Frequency of Communication with Friends and Family: Not on file  . Frequency of Social Gatherings with Friends and Family: Not on file  . Attends Religious Services: Not on file  . Active Member of Clubs or Organizations: Not on file  . Attends Banker Meetings: Not on file  . Marital Status: Not on file  Intimate Partner Violence:   . Fear of Current or Ex-Partner: Not on file  . Emotionally  Abused: Not on file  . Physically Abused: Not on file  . Sexually Abused: Not on file   Family History  Problem Relation Age of Onset  . Diabetes Mother        borderline  . Atrial fibrillation Father   . Diabetes Maternal Grandmother      OBJECTIVE:  Vitals:   03/20/20 1125  BP: (!) 134/94  Pulse: 80  Resp: 20  Temp: 98 F (36.7 C)  SpO2: 96%    General appearance: Alert; NAD HEENT: NCAT.  Oropharynx clear.  Lungs: clear to auscultation bilaterally without adventitious breath sounds Heart: regular rate and rhythm.  Radial pulses 2+ symmetrical bilaterally Abdomen: soft, non-distended; normal active bowel sounds; tenderness to light and deep palpation on left lower quadrant; nontender at McBurney's point; negative Murphy's sign; negative rebound; no guarding Back: no CVA tenderness Extremities: no edema; symmetrical with no gross deformities Skin: warm and dry Neurologic: normal gait Psychological: alert and cooperative; normal mood and affect  LABS: No results found for this or any previous visit (from the past 24 hour(s)).  DIAGNOSTIC STUDIES: No results found.   ASSESSMENT & PLAN:  1. Abdominal pain, left lower quadrant   2. Diverticulitis     Meds ordered this encounter  Medications  . metroNIDAZOLE (FLAGYL) 500 MG tablet    Sig: Take 1 tablet (500 mg total) by mouth 3 (three) times daily for 7 days.    Dispense:  21 tablet    Refill:  0  . ciprofloxacin (CIPRO) 500 MG tablet    Sig: Take 1 tablet (500 mg total) by mouth 2 (two) times daily for 7 days.    Dispense:  14 tablet    Refill:  0     Discharge instructions  Ciprofloxacin was prescribed take as directed Metronidazole was prescribed take as directed Follow-up with PCP Use OTC MiraLAX for constipation If you experience new or worsening symptoms return or go to ER such as fever, chills, nausea, vomiting, diarrhea, bloody or dark tarry stools, constipation, urinary symptoms, worsening  abdominal discomfort, symptoms that do not improve with medications, inability to keep fluids down, etc...  Reviewed expectations re: course of current medical issues. Questions answered. Outlined signs and symptoms indicating need for more acute intervention. Patient verbalized understanding. After Visit Summary given.   Durward Parcel, FNP 03/20/20 1144

## 2020-06-05 DIAGNOSIS — N400 Enlarged prostate without lower urinary tract symptoms: Secondary | ICD-10-CM | POA: Diagnosis not present

## 2020-06-05 DIAGNOSIS — G4700 Insomnia, unspecified: Secondary | ICD-10-CM | POA: Diagnosis not present

## 2020-06-05 DIAGNOSIS — Z79899 Other long term (current) drug therapy: Secondary | ICD-10-CM | POA: Diagnosis not present

## 2020-06-05 DIAGNOSIS — Z125 Encounter for screening for malignant neoplasm of prostate: Secondary | ICD-10-CM | POA: Diagnosis not present

## 2020-06-05 DIAGNOSIS — E785 Hyperlipidemia, unspecified: Secondary | ICD-10-CM | POA: Diagnosis not present

## 2020-06-12 DIAGNOSIS — H9319 Tinnitus, unspecified ear: Secondary | ICD-10-CM | POA: Diagnosis not present

## 2020-06-12 DIAGNOSIS — E785 Hyperlipidemia, unspecified: Secondary | ICD-10-CM | POA: Diagnosis not present

## 2020-06-12 DIAGNOSIS — Z0000 Encounter for general adult medical examination without abnormal findings: Secondary | ICD-10-CM | POA: Diagnosis not present

## 2020-06-12 DIAGNOSIS — D690 Allergic purpura: Secondary | ICD-10-CM | POA: Diagnosis not present

## 2020-06-27 DIAGNOSIS — H9312 Tinnitus, left ear: Secondary | ICD-10-CM | POA: Diagnosis not present

## 2020-06-27 DIAGNOSIS — H93292 Other abnormal auditory perceptions, left ear: Secondary | ICD-10-CM | POA: Diagnosis not present

## 2021-05-04 DIAGNOSIS — Z23 Encounter for immunization: Secondary | ICD-10-CM | POA: Diagnosis not present

## 2021-06-04 DIAGNOSIS — R945 Abnormal results of liver function studies: Secondary | ICD-10-CM | POA: Diagnosis not present

## 2021-06-04 DIAGNOSIS — Z125 Encounter for screening for malignant neoplasm of prostate: Secondary | ICD-10-CM | POA: Diagnosis not present

## 2021-06-04 DIAGNOSIS — G4700 Insomnia, unspecified: Secondary | ICD-10-CM | POA: Diagnosis not present

## 2021-06-04 DIAGNOSIS — E785 Hyperlipidemia, unspecified: Secondary | ICD-10-CM | POA: Diagnosis not present

## 2021-06-04 DIAGNOSIS — D696 Thrombocytopenia, unspecified: Secondary | ICD-10-CM | POA: Diagnosis not present

## 2021-06-04 DIAGNOSIS — Z79899 Other long term (current) drug therapy: Secondary | ICD-10-CM | POA: Diagnosis not present

## 2021-06-15 DIAGNOSIS — Z0000 Encounter for general adult medical examination without abnormal findings: Secondary | ICD-10-CM | POA: Diagnosis not present

## 2021-06-15 DIAGNOSIS — E785 Hyperlipidemia, unspecified: Secondary | ICD-10-CM | POA: Diagnosis not present

## 2021-06-15 DIAGNOSIS — Z6827 Body mass index (BMI) 27.0-27.9, adult: Secondary | ICD-10-CM | POA: Diagnosis not present

## 2021-06-15 DIAGNOSIS — R7301 Impaired fasting glucose: Secondary | ICD-10-CM | POA: Diagnosis not present

## 2021-07-24 ENCOUNTER — Other Ambulatory Visit (HOSPITAL_COMMUNITY): Payer: Self-pay | Admitting: Internal Medicine

## 2021-07-24 DIAGNOSIS — R059 Cough, unspecified: Secondary | ICD-10-CM

## 2021-07-25 ENCOUNTER — Ambulatory Visit (HOSPITAL_COMMUNITY)
Admission: RE | Admit: 2021-07-25 | Discharge: 2021-07-25 | Disposition: A | Discharge status: Home / Self Care | Payer: BC Managed Care – PPO | Source: Ambulatory Visit | Attending: Internal Medicine | Admitting: Internal Medicine

## 2021-07-25 ENCOUNTER — Other Ambulatory Visit: Payer: Self-pay

## 2021-07-25 DIAGNOSIS — R059 Cough, unspecified: Secondary | ICD-10-CM | POA: Insufficient documentation

## 2021-07-25 DIAGNOSIS — R0789 Other chest pain: Secondary | ICD-10-CM | POA: Diagnosis not present

## 2021-07-25 DIAGNOSIS — R0602 Shortness of breath: Secondary | ICD-10-CM | POA: Diagnosis not present

## 2022-03-16 IMAGING — CT CT ABD-PELV W/ CM
2 of 5 series · 17 of 46 positions shown, 19 images · IV contrast (Omnipaque or Isovue)
Comparison: None.

CLINICAL DATA: Left side abdominal pain

EXAM:
CT ABDOMEN AND PELVIS WITH CONTRAST
TECHNIQUE: Multidetector CT imaging of the abdomen and pelvis was performed
using the standard protocol following bolus administration of
intravenous contrast.
CONTRAST:  100mL OMNIPAQUE IOHEXOL 300 MG/ML  SOLN

[Series 2: axial st · axial · 0.75mm/px · z∈[-886,-476]mm · 14 of 94 slices shown, 16 images]
[im 6/94  soft-tissue]
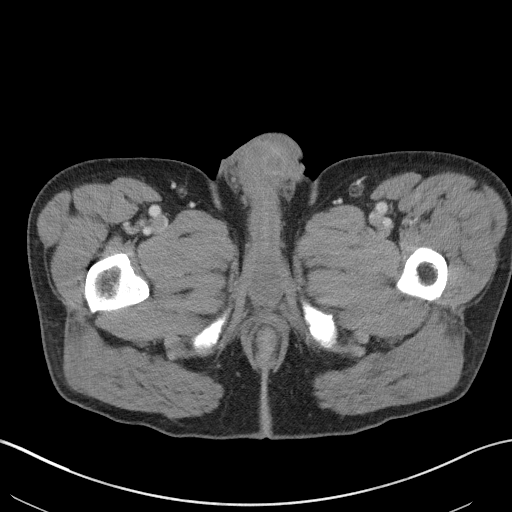
[im 6/94  bone]
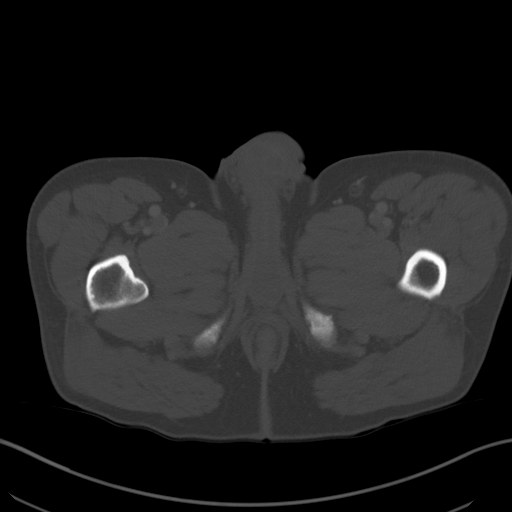
[im 11/94  soft-tissue]
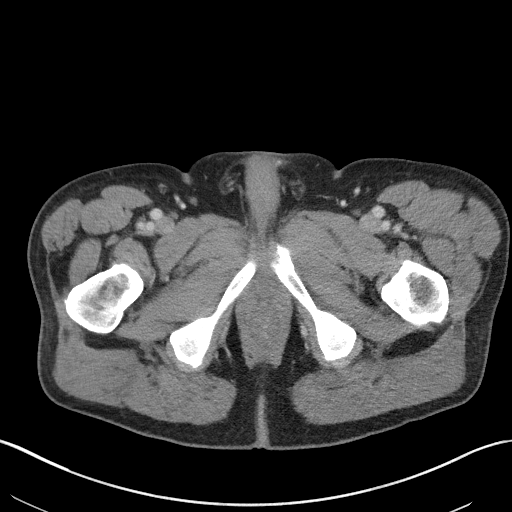
[im 21/94  soft-tissue]
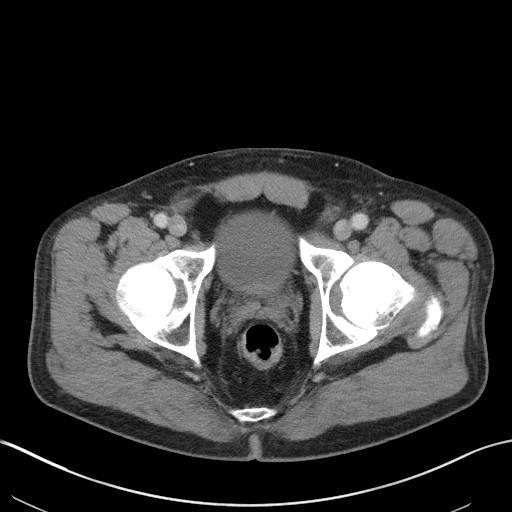
[im 26/94  soft-tissue]
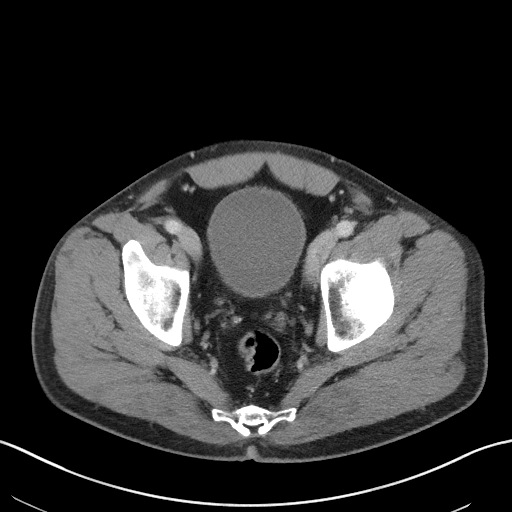
[im 32/94  soft-tissue]
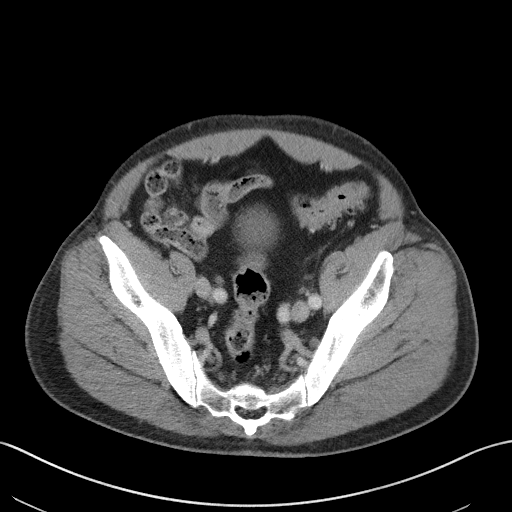
[im 37/94  soft-tissue]
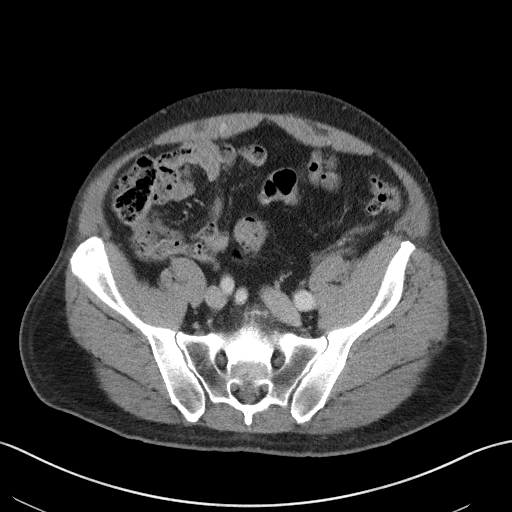
[im 42/94  soft-tissue]
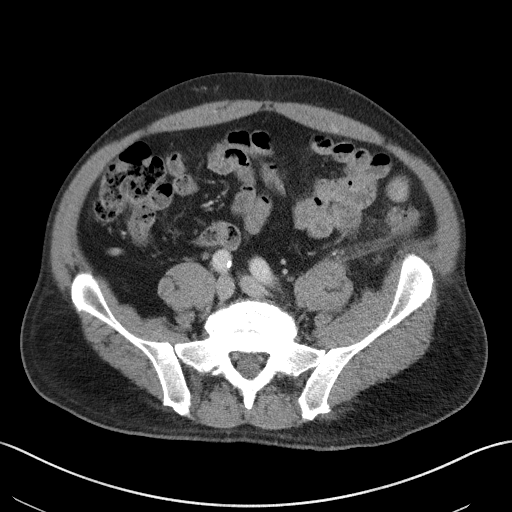
[im 52/94  soft-tissue]
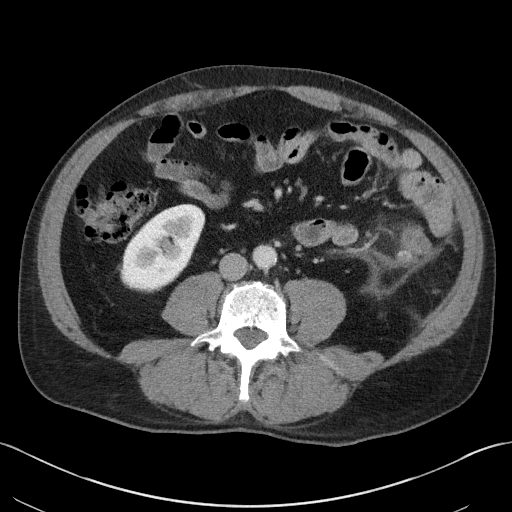
[im 57/94  soft-tissue]
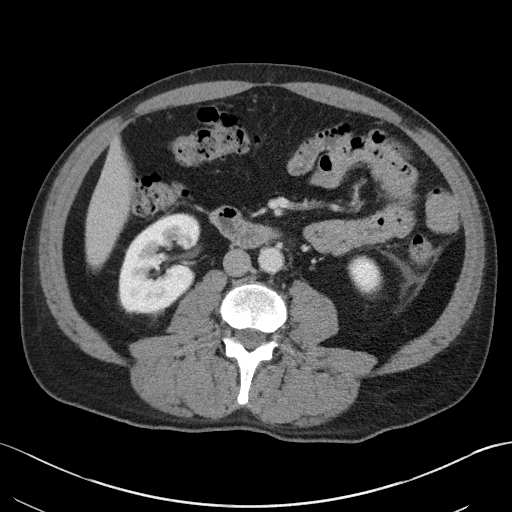
[im 57/94  bone]
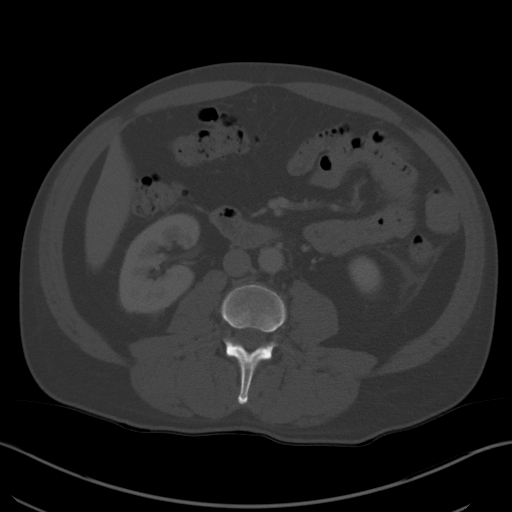
[im 63/94  soft-tissue]
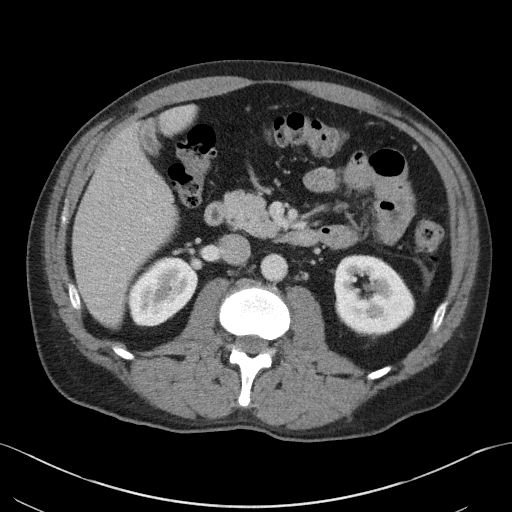
[im 68/94  soft-tissue]
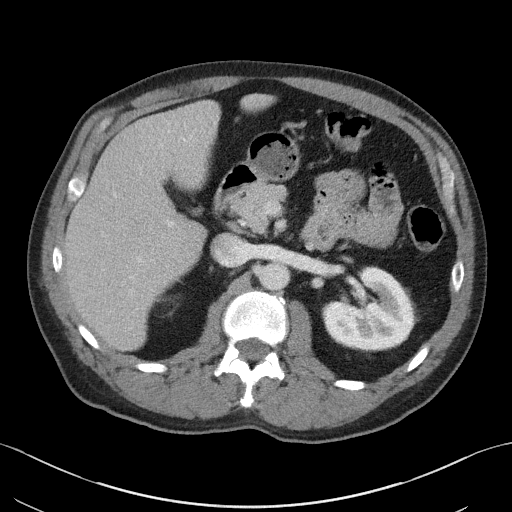
[im 73/94  soft-tissue]
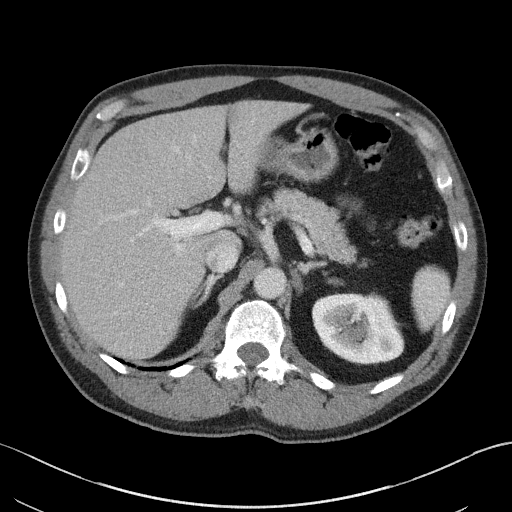
[im 83/94  soft-tissue]
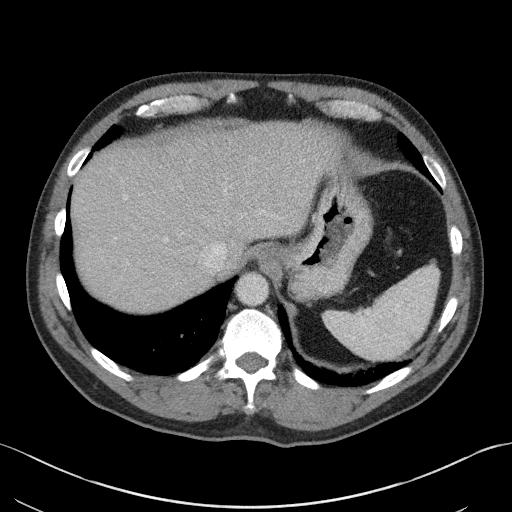
[im 88/94  soft-tissue]
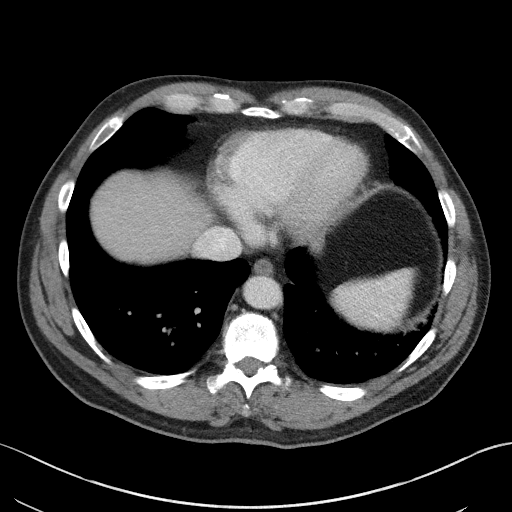

[Series 5: coronal st · coronal · 0.78mm/px · 3 of 117 slices shown]
[im 39/117  soft-tissue]
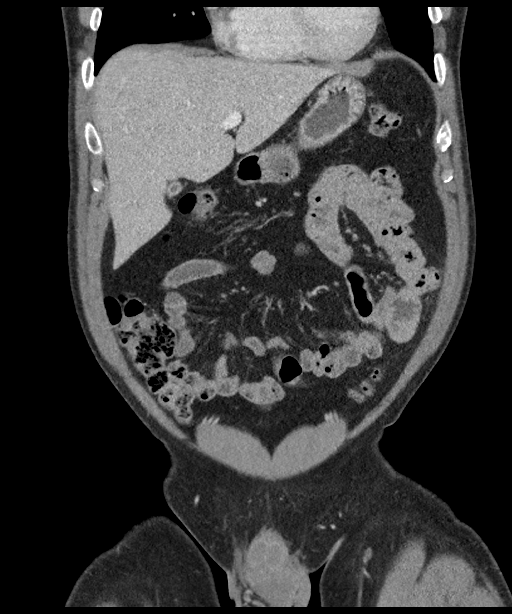
[im 52/117  soft-tissue]
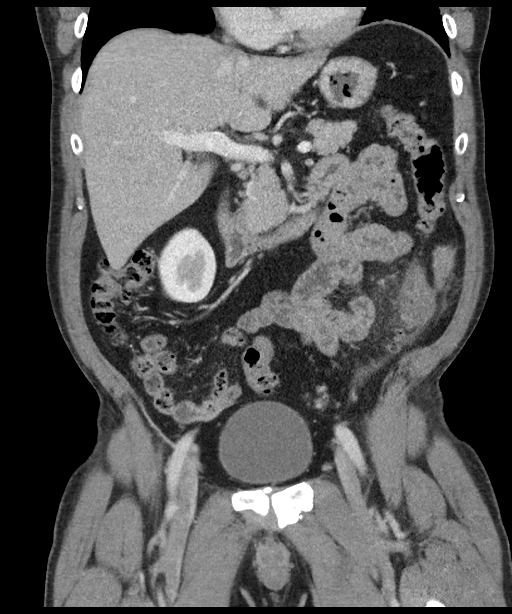
[im 65/117  soft-tissue]
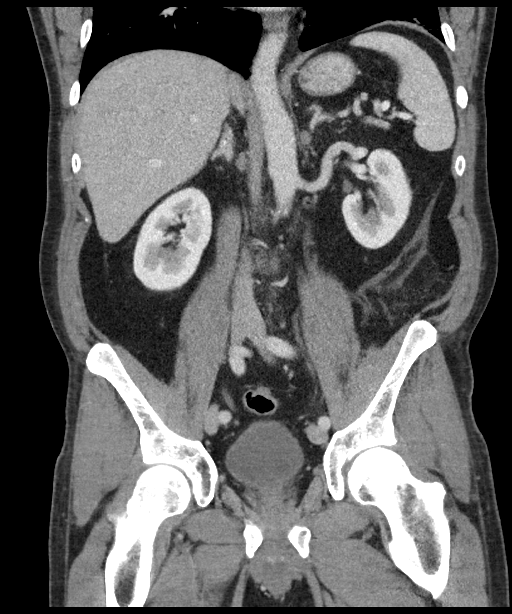

[17 of 46 positions shown; findings below may reference images not displayed]

FINDINGS: Lower chest: Lung bases are clear. No effusions. Heart is normal
size.

Hepatobiliary: No focal hepatic abnormality. Gallbladder
unremarkable.

Pancreas: No focal abnormality or ductal dilatation.

Spleen: No focal abnormality.  Normal size.

Adrenals/Urinary Tract: No adrenal abnormality. No focal renal
abnormality. No stones or hydronephrosis. Urinary bladder is
unremarkable.

Stomach/Bowel: Descending colonic and sigmoid diverticulosis.
Extensive inflammatory stranding around the mid and distal
descending colon compatible with active diverticulitis. Normal
appendix. Stomach and small bowel unremarkable.

Vascular/Lymphatic: No evidence of aneurysm or adenopathy.

Reproductive: Central prostate calcifications.

Other: No free fluid or free air.

Musculoskeletal: No acute bony abnormality.
IMPRESSION: Left colonic diverticulosis. Extensive inflammatory stranding around
the mid and distal descending colon compatible with active
diverticulitis.

## 2022-04-09 DIAGNOSIS — M7741 Metatarsalgia, right foot: Secondary | ICD-10-CM | POA: Diagnosis not present

## 2022-04-09 DIAGNOSIS — M79671 Pain in right foot: Secondary | ICD-10-CM | POA: Diagnosis not present

## 2022-04-30 DIAGNOSIS — M79672 Pain in left foot: Secondary | ICD-10-CM | POA: Diagnosis not present

## 2022-04-30 DIAGNOSIS — M7742 Metatarsalgia, left foot: Secondary | ICD-10-CM | POA: Diagnosis not present

## 2022-05-06 ENCOUNTER — Other Ambulatory Visit: Payer: Self-pay

## 2022-05-06 ENCOUNTER — Encounter (HOSPITAL_COMMUNITY): Payer: Self-pay | Admitting: *Deleted

## 2022-05-06 ENCOUNTER — Emergency Department (HOSPITAL_COMMUNITY)
Admission: EM | Admit: 2022-05-06 | Discharge: 2022-05-06 | Disposition: A | Discharge status: Home / Self Care | Payer: BC Managed Care – PPO | Attending: Emergency Medicine | Admitting: Emergency Medicine

## 2022-05-06 ENCOUNTER — Emergency Department (HOSPITAL_COMMUNITY): Payer: BC Managed Care – PPO

## 2022-05-06 DIAGNOSIS — R739 Hyperglycemia, unspecified: Secondary | ICD-10-CM | POA: Insufficient documentation

## 2022-05-06 DIAGNOSIS — R1030 Lower abdominal pain, unspecified: Secondary | ICD-10-CM | POA: Diagnosis not present

## 2022-05-06 DIAGNOSIS — K5732 Diverticulitis of large intestine without perforation or abscess without bleeding: Secondary | ICD-10-CM | POA: Insufficient documentation

## 2022-05-06 DIAGNOSIS — K5792 Diverticulitis of intestine, part unspecified, without perforation or abscess without bleeding: Secondary | ICD-10-CM | POA: Diagnosis not present

## 2022-05-06 DIAGNOSIS — K5730 Diverticulosis of large intestine without perforation or abscess without bleeding: Secondary | ICD-10-CM | POA: Diagnosis not present

## 2022-05-06 LAB — COMPREHENSIVE METABOLIC PANEL
ALT: 29 U/L (ref 0–44)
AST: 19 U/L (ref 15–41)
Albumin: 4 g/dL (ref 3.5–5.0)
Alkaline Phosphatase: 62 U/L (ref 38–126)
Anion gap: 9 (ref 5–15)
BUN: 21 mg/dL — ABNORMAL HIGH (ref 6–20)
CO2: 26 mmol/L (ref 22–32)
Calcium: 9.1 mg/dL (ref 8.9–10.3)
Chloride: 102 mmol/L (ref 98–111)
Creatinine, Ser: 0.92 mg/dL (ref 0.61–1.24)
GFR, Estimated: >60 mL/min (ref >60)
Glucose, Bld: 116 mg/dL — ABNORMAL HIGH (ref 70–99)
Potassium: 3.7 mmol/L (ref 3.5–5.1)
Sodium: 137 mmol/L (ref 135–145)
Total Bilirubin: 0.4 mg/dL (ref 0.3–1.2)
Total Protein: 7.6 g/dL (ref 6.5–8.1)

## 2022-05-06 LAB — LIPASE, BLOOD: Lipase: 36 U/L (ref 11–51)

## 2022-05-06 LAB — CBC
HCT: 44.2 % (ref 39.0–52.0)
Hemoglobin: 14.6 g/dL (ref 13.0–17.0)
MCH: 29.3 pg (ref 26.0–34.0)
MCHC: 33 g/dL (ref 30.0–36.0)
MCV: 88.8 fL (ref 80.0–100.0)
Platelets: 138 10*3/uL — ABNORMAL LOW (ref 150–400)
RBC: 4.98 MIL/uL (ref 4.22–5.81)
RDW: 13.2 % (ref 11.5–15.5)
WBC: 6.1 10*3/uL (ref 4.0–10.5)
nRBC: 0 % (ref 0.0–0.2)

## 2022-05-06 LAB — URINALYSIS, ROUTINE W REFLEX MICROSCOPIC
Bilirubin Urine: NEGATIVE
Glucose, UA: NEGATIVE mg/dL
Hgb urine dipstick: NEGATIVE
Ketones, ur: NEGATIVE mg/dL
Leukocytes,Ua: NEGATIVE
Nitrite: NEGATIVE
Protein, ur: NEGATIVE mg/dL
Specific Gravity, Urine: 1.018 (ref 1.005–1.030)
pH: 6 (ref 5.0–8.0)

## 2022-05-06 MED ORDER — IOHEXOL 300 MG/ML  SOLN
100.0000 mL | Freq: Once | INTRAMUSCULAR | Status: AC | PRN
  Administered 2022-05-06: 100 mL via INTRAVENOUS

## 2022-05-06 MED ORDER — METRONIDAZOLE 500 MG PO TABS
500.0000 mg | ORAL_TABLET | Freq: Once | ORAL | Status: AC
  Administered 2022-05-06: 500 mg via ORAL
  Filled 2022-05-06: qty 1

## 2022-05-06 MED ORDER — METRONIDAZOLE 500 MG PO TABS: 500.0000 mg | ORAL_TABLET | Freq: Three times a day (TID) | ORAL | 0 refills | Status: AC

## 2022-05-06 MED ORDER — CIPROFLOXACIN HCL 500 MG PO TABS: 500.0000 mg | ORAL_TABLET | Freq: Two times a day (BID) | ORAL | 0 refills | Status: AC

## 2022-05-06 MED ORDER — CIPROFLOXACIN HCL 250 MG PO TABS
500.0000 mg | ORAL_TABLET | Freq: Once | ORAL | Status: AC
  Administered 2022-05-06: 500 mg via ORAL
  Filled 2022-05-06: qty 2

## 2022-05-06 NOTE — ED Triage Notes (Signed)
Pt with abd pain since Friday morning.  Denies any N/V/D.  LBM this morning.  Hx of constipation and took Magnesium citrate yesterday.  Pain to lower abd and lower back/hips.  Denies burning with urination.

## 2022-05-06 NOTE — ED Notes (Signed)
Patient verbalizes understanding of discharge instructions. Opportunity for questioning and answers were provided. Armband removed by staff, pt discharged from ED. Ambulated out to lobby  

## 2022-05-06 NOTE — Discharge Instructions (Addendum)
You have been seen today for your complaint of abdominal pain. Your lab work was overall reassuring and showed no abnormalities. Your imaging showed that she had a. Your discharge medications include ciprofloxacin.  This is an antibiotic.  You should take it as prescribed.  You should take it for the entire duration of the prescription. Metronidazole.  This is an antibiotic.  You should take it as prescribed.  You should take it for the entire duration of the prescription.  You cannot drink while taking this medication. Home care instructions are as follows:  Drink mostly clear liquids for the next 3 to 4 days Follow up with: Your primary care provider in 1 week Please seek immediate medical care if you develop any of the following symptoms: Your pain gets worse. Your symptoms do not get better with treatment. Your symptoms suddenly get worse. You have a fever. You vomit more than one time. You have stools that are bloody, black, or tarry. At this time there does not appear to be the presence of an emergent medical condition, however there is always the potential for conditions to change. Please read and follow the below instructions.  Do not take your medicine if  develop an itchy rash, swelling in your mouth or lips, or difficulty breathing; call 911 and seek immediate emergency medical attention if this occurs.  You may review your lab tests and imaging results in their entirety on your MyChart account.  Please discuss all results of fully with your primary care provider and other specialist at your follow-up visit.  Note: Portions of this text may have been transcribed using voice recognition software. Every effort was made to ensure accuracy; however, inadvertent computerized transcription errors may still be present.

## 2022-05-06 NOTE — ED Provider Notes (Signed)
[facility] EMERGENCY DEPARTMENT Provider Note   CSN: [id number] Arrival date & time: 05/06/22  1119     History  Chief Complaint  Patient presents with   Abdominal Pain    [name] is a 56 y.o. male.  With a history of diverticulitis who presents ED for evaluation of abdominal pain.  Patient states the pain started Friday morning.  Localizes the pain to the lower abdomen with left worse than right.  States he typically has abdominal pain that is relieved by having a bowel movement, however he has had multiple bowel movements and the pain persists.  He took magnesium citrate on Saturday and reported having multiple loose bowel movements after that, but has returned to normal bowel movements today.  Pain radiates to the low back and bilateral hips.  Reports history of inguinal hernia repair, denies other abdominal surgeries.  States his pain does not feel like when he has had diverticulitis in the past.  Describes the pain as a dull ache.  Rates it at a 4 out of 10.  Denies fevers, chills, chest pain, shortness of breath, nausea, vomiting, melena, hematochezia, hematemesis   Abdominal Pain      Home Medications Prior to Admission medications   Medication Sig Start Date End Date Taking? Authorizing Provider  ibuprofen (ADVIL,MOTRIN) 200 MG tablet Take 400 mg by mouth every 6 (six) hours as needed. For body aches    [provider]  naproxen (NAPROSYN) 500 MG tablet Take 1 tablet (500 mg total) by mouth 2 (two) times daily with a meal. 12/03/18   Darreld Mclean, MD  oxyCODONE (ROXICODONE) 5 MG immediate release tablet Take 1 tablet (5 mg total) by mouth every 4 (four) hours as needed for severe pain. 11/27/19   Sabas Sous, MD  temazepam (RESTORIL) 15 MG capsule Take 15 mg by mouth at bedtime as needed. For sleep    [provider]  testosterone cypionate (DEPOTESTOTERONE CYPIONATE) 100 MG/ML injection Inject 100 mg into the muscle every 7 (seven) days. For IM  use only    [provider]      Allergies    Patient has no known allergies.    Review of Systems   Review of Systems  Gastrointestinal:  Positive for abdominal pain.  All other systems reviewed and are negative.   Physical Exam Updated Vital Signs BP (!) 142/80   Pulse 70   Temp 98.9 F (37.2 C) (Oral)   Resp 18   Ht 5\' 11"  (1.803 m)   Wt 93 kg   SpO2 98%   BMI 28.59 kg/m  Physical Exam Vitals and nursing note reviewed.  Constitutional:      General: He is not in acute distress.    Appearance: He is well-developed. He is not ill-appearing, toxic-appearing or diaphoretic.  HENT:     Head: Normocephalic and atraumatic.  Eyes:     Conjunctiva/sclera: Conjunctivae normal.  Cardiovascular:     Rate and Rhythm: Normal rate and regular rhythm.     Heart sounds: No murmur heard. Pulmonary:     Effort: Pulmonary effort is normal. No respiratory distress.     Breath sounds: Normal breath sounds. No stridor. No wheezing, rhonchi or rales.  Abdominal:     General: Abdomen is flat. Bowel sounds are normal. There is no distension.     Palpations: Abdomen is soft.     Tenderness: There is abdominal tenderness in the right lower quadrant, suprapubic area and left lower quadrant.  There is no guarding or rebound. Positive signs include McBurney's sign. Negative signs include Murphy's sign, Rovsing's sign, psoas sign and obturator sign.  Musculoskeletal:        General: No swelling.     Cervical back: Neck supple.  Skin:    General: Skin is warm and dry.     Capillary Refill: Capillary refill takes less than 2 seconds.  Neurological:     General: No focal deficit present.     Mental Status: He is alert and oriented to person, place, and time.  Psychiatric:        Mood and Affect: Mood normal.     ED Results / Procedures / Treatments   Labs (all labs ordered are listed, but only abnormal results are displayed) Labs Reviewed  COMPREHENSIVE METABOLIC PANEL -  Abnormal; Notable for the following components:      Result Value   Glucose, Bld 116 (*)    BUN 21 (*)    All other components within normal limits  CBC - Abnormal; Notable for the following components:   Platelets 138 (*)    All other components within normal limits  LIPASE, BLOOD  URINALYSIS, ROUTINE W REFLEX MICROSCOPIC    EKG None  Radiology CT Abdomen Pelvis W Contrast  Result Date: 05/06/2022 CLINICAL DATA:  Left lower quadrant pain EXAM: CT ABDOMEN AND PELVIS WITH CONTRAST TECHNIQUE: Multidetector CT imaging of the abdomen and pelvis was performed using the standard protocol following bolus administration of intravenous contrast. RADIATION DOSE REDUCTION: This exam was performed according to the departmental dose-optimization program which includes automated exposure control, adjustment of the mA and/or kV according to patient size and/or use of iterative reconstruction technique. CONTRAST:  OMNIPAQUE IOHEXOL 300 MG/ML  SOLN COMPARISON:  CT abdomen and pelvis dated 11/27/2019 FINDINGS: Lower chest: No focal consolidation or pulmonary nodule in the lung bases. No pleural effusion or pneumothorax demonstrated. Partially imaged heart size is normal. Hepatobiliary: No focal hepatic lesions. No intra or extrahepatic biliary ductal dilation. Normal gallbladder. Pancreas: No focal lesions or main ductal dilation. Spleen: Normal in size without focal abnormality. Adrenals/Urinary Tract: No adrenal nodules. No suspicious renal mass, calculi or hydronephrosis. No focal bladder wall thickening. Stomach/Bowel: Normal appearance of the stomach. Sigmoid diverticulitis. With a few punctate foci of air extending into the areas of mesenteric stranding. No fluid collection. Normal appendix. Vascular/Lymphatic: Aortic atherosclerosis. No enlarged abdominal or pelvic lymph nodes. Reproductive: Prostate is unremarkable. Other: No free fluid. Peritoneal thickening and stranding along the left lower  quadrant. Musculoskeletal: No acute or abnormal lytic or blastic osseous lesions. IMPRESSION: 1. Acute sigmoid diverticulitis with a few punctate foci of air extending into the areas of mesenteric stranding. No frank perforation. No fluid collection. 2.  Aortic Atherosclerosis (ICD10-I70.0). Electronically Signed   By: Agustin Cree M.D.   On: 05/06/2022 20:04    Procedures Procedures    Medications Ordered in ED Medications  iohexol (OMNIPAQUE) 300 MG/ML solution 100 mL (100 mLs Intravenous Contrast Given 05/06/22 1953)  ciprofloxacin (CIPRO) tablet 500 mg (500 mg Oral Given 05/06/22 2139)  metroNIDAZOLE (FLAGYL) tablet 500 mg (500 mg Oral Given 05/06/22 2140)    ED Course/ Medical Decision Making/ A&P                           Medical Decision Making Amount and/or Complexity of Data Reviewed Labs: ordered. Radiology: ordered.  Risk Prescription drug management.  This patient presents to the ED  for concern of abdominal pain, this involves an extensive number of treatment options, and is a complaint that carries with it a high risk of complications and morbidity.  The differential diagnosis for generalized abdominal pain includes, but is not limited to AAA, gastroenteritis, appendicitis, Bowel obstruction, Bowel perforation. Gastroparesis, DKA, Hernia, Inflammatory bowel disease, mesenteric ischemia, pancreatitis, peritonitis SBP, volvulus.    Co morbidities that complicate the patient evaluation   history of diverticulitis  My initial workup includes abdominal pain labs, CT abdomen  Additional history obtained from: Nursing notes from this visit.  I ordered, reviewed and interpreted labs which include:BMP, CBC, lipase, urinalysis.  Slight hyperglycemia of 116.  Otherwise normal.   I ordered imaging studies including CT abdomen pelvis I independently visualized and interpreted imaging which showed diverticulitis with small amount of free air, but no obvious perforation or fluid  collection to suggest an abscess I agree with the radiologist interpretation  Afebrile, hemodynamically stable.  56 year old male with a history of diverticulitis presenting to the ED for evaluation of lower abdominal pain.  Physical exam remarkable for minimal tenderness to palpation along the lower abdomen.  Lab work-up unremarkable.  CT does show acute diverticulitis without obvious abscess or perforation.  Patient will be started on Cipro and Flagyl.  He will be given his first dose today.  Will be sent a prescription for the next 14 days.  He was given strict return precautions.  Stable at discharge.  At this time there does not appear to be any evidence of an acute emergency medical condition and the patient appears stable for discharge with appropriate outpatient follow up. Diagnosis was discussed with patient who verbalizes understanding of care plan and is agreeable to discharge. I have discussed return precautions with patient who verbalizes understanding. Patient encouraged to follow-up with their PCP within 1 week. All questions answered.  Patient's case discussed with Dr. Renaye Rakers who agrees with plan to discharge with follow-up.   Note: Portions of this report may have been transcribed using voice recognition software. Every effort was made to ensure accuracy; however, inadvertent computerized transcription errors may still be present.          Final Clinical Impression(s) / ED Diagnoses Final diagnoses:  None    Rx / DC Orders ED Discharge Orders     None         Mora Bellman 05/06/22 2218    Terald Sleeper, MD 05/06/22 [phone]

## 2022-05-06 NOTE — ED Notes (Signed)
Patient transported to CT 

## 2022-05-16 DIAGNOSIS — K5792 Diverticulitis of intestine, part unspecified, without perforation or abscess without bleeding: Secondary | ICD-10-CM | POA: Diagnosis not present

## 2022-06-14 DIAGNOSIS — G4700 Insomnia, unspecified: Secondary | ICD-10-CM | POA: Diagnosis not present

## 2022-06-14 DIAGNOSIS — R7301 Impaired fasting glucose: Secondary | ICD-10-CM | POA: Diagnosis not present

## 2022-06-14 DIAGNOSIS — D696 Thrombocytopenia, unspecified: Secondary | ICD-10-CM | POA: Diagnosis not present

## 2022-06-14 DIAGNOSIS — E785 Hyperlipidemia, unspecified: Secondary | ICD-10-CM | POA: Diagnosis not present

## 2022-06-14 DIAGNOSIS — Z79899 Other long term (current) drug therapy: Secondary | ICD-10-CM | POA: Diagnosis not present

## 2022-06-14 DIAGNOSIS — Z125 Encounter for screening for malignant neoplasm of prostate: Secondary | ICD-10-CM | POA: Diagnosis not present

## 2022-06-21 DIAGNOSIS — Z0000 Encounter for general adult medical examination without abnormal findings: Secondary | ICD-10-CM | POA: Diagnosis not present

## 2022-06-21 DIAGNOSIS — R7309 Other abnormal glucose: Secondary | ICD-10-CM | POA: Diagnosis not present

## 2022-06-21 DIAGNOSIS — E785 Hyperlipidemia, unspecified: Secondary | ICD-10-CM | POA: Diagnosis not present

## 2022-06-21 DIAGNOSIS — D696 Thrombocytopenia, unspecified: Secondary | ICD-10-CM | POA: Diagnosis not present

## 2022-07-15 DIAGNOSIS — R3914 Feeling of incomplete bladder emptying: Secondary | ICD-10-CM | POA: Diagnosis not present

## 2022-07-15 DIAGNOSIS — N401 Enlarged prostate with lower urinary tract symptoms: Secondary | ICD-10-CM | POA: Diagnosis not present

## 2022-07-15 DIAGNOSIS — R350 Frequency of micturition: Secondary | ICD-10-CM | POA: Diagnosis not present

## 2022-07-15 DIAGNOSIS — R351 Nocturia: Secondary | ICD-10-CM | POA: Diagnosis not present

## 2022-07-15 DIAGNOSIS — R3912 Poor urinary stream: Secondary | ICD-10-CM | POA: Diagnosis not present

## 2022-07-22 ENCOUNTER — Other Ambulatory Visit (HOSPITAL_COMMUNITY): Payer: Self-pay | Admitting: Internal Medicine

## 2022-07-22 ENCOUNTER — Ambulatory Visit (HOSPITAL_COMMUNITY)
Admission: RE | Admit: 2022-07-22 | Discharge: 2022-07-22 | Disposition: A | Discharge status: Home / Self Care | Payer: BC Managed Care – PPO | Source: Ambulatory Visit | Attending: Internal Medicine | Admitting: Internal Medicine

## 2022-07-22 DIAGNOSIS — Y9323 Activity, snow (alpine) (downhill) skiing, snow boarding, sledding, tobogganing and snow tubing: Secondary | ICD-10-CM | POA: Diagnosis not present

## 2022-07-22 DIAGNOSIS — W1830XA Fall on same level, unspecified, initial encounter: Secondary | ICD-10-CM | POA: Insufficient documentation

## 2022-07-22 DIAGNOSIS — S299XXA Unspecified injury of thorax, initial encounter: Secondary | ICD-10-CM | POA: Diagnosis not present

## 2022-07-22 DIAGNOSIS — R0781 Pleurodynia: Secondary | ICD-10-CM | POA: Insufficient documentation

## 2022-09-06 DIAGNOSIS — K5792 Diverticulitis of intestine, part unspecified, without perforation or abscess without bleeding: Secondary | ICD-10-CM | POA: Diagnosis not present

## 2022-09-18 DIAGNOSIS — R350 Frequency of micturition: Secondary | ICD-10-CM | POA: Diagnosis not present

## 2022-09-18 DIAGNOSIS — N35011 Post-traumatic bulbous urethral stricture: Secondary | ICD-10-CM | POA: Diagnosis not present

## 2022-09-18 DIAGNOSIS — R3912 Poor urinary stream: Secondary | ICD-10-CM | POA: Diagnosis not present

## 2022-09-20 ENCOUNTER — Other Ambulatory Visit: Payer: Self-pay | Admitting: Urology

## 2022-09-24 NOTE — Patient Instructions (Signed)
SURGICAL WAITING ROOM VISITATION  Patients having surgery or a procedure may have no more than 2 support people in the waiting area - these visitors may rotate.    Children under the age of 92 must have an adult with them who is not the patient.  Due to an increase in RSV and influenza rates and associated hospitalizations, children ages 66 and under may not visit patients in [facility] hospitals.  If the patient needs to stay at the hospital during part of their recovery, the visitor guidelines for inpatient rooms apply. Pre-op nurse will coordinate an appropriate time for 1 support person to accompany patient in pre-op.  This support person may not rotate.    Please refer to the [facility] website for the visitor guidelines for Inpatients (after your surgery is over and you are in a regular room).       Your procedure is scheduled on:  09/27/2022    Report to [facility] Main Entrance    Report to admitting at  0730 AM   Call this number if you have problems the morning of surgery [phone]   Do not eat food or drink liquids  :After Midnight.                             If you have questions, please contact your surgeon's office.    Oral Hygiene is also important to reduce your risk of infection.                                    Remember - BRUSH YOUR TEETH THE MORNING OF SURGERY WITH YOUR REGULAR TOOTHPASTE  DENTURES WILL BE REMOVED PRIOR TO SURGERY PLEASE DO NOT APPLY "Poly grip" OR ADHESIVES!!!   Do NOT smoke after Midnight   Take these medicines the morning of surgery with A SIP OF WATER:  rapaflo   DO NOT TAKE ANY ORAL DIABETIC MEDICATIONS DAY OF YOUR SURGERY  Bring CPAP mask and tubing day of surgery.                              You may not have any metal on your body including hair pins, jewelry, and body piercing             Do not wear make-up, lotions, powders, perfumes/cologne, or deodorant  Do not wear nail polish including gel and S&S,  artificial/acrylic nails, or any other type of covering on natural nails including finger and toenails. If you have artificial nails, gel coating, etc. that needs to be removed by a nail salon please have this removed prior to surgery or surgery may need to be canceled/ delayed if the surgeon/ anesthesia feels like they are unable to be safely monitored.   Do not shave  48 hours prior to surgery.               Men may shave face and neck.   Do not bring valuables to the hospital. [facility] IS NOT             RESPONSIBLE   FOR VALUABLES.   Contacts, glasses, dentures or bridgework may not be worn into surgery.   Bring small overnight bag day of surgery.   DO NOT BRING YOUR HOME MEDICATIONS TO THE HOSPITAL. PHARMACY WILL DISPENSE MEDICATIONS  LISTED ON YOUR MEDICATION LIST TO YOU DURING [place]!    Patients discharged on the day of surgery will not be allowed to drive home.  Someone NEEDS to stay with you for the first 24 hours after anesthesia.   Special Instructions: Bring a copy of your healthcare power of attorney and living will documents the day of surgery if you haven't scanned them before.              Please read over the following fact sheets you were given: IF [place] [phone]   If you received a COVID test during your pre-op visit  it is requested that you wear a mask when out in public, stay away from anyone that may not be feeling well and notify your surgeon if you develop symptoms. If you test positive for Covid or have been in contact with anyone that has tested positive in the last 10 days please notify you surgeon.    [facility] - Preparing for Surgery Before surgery, you can play an important role.  Because skin is not sterile, your skin needs to be as free of germs as possible.  You can reduce the number of germs on your skin by washing with CHG (chlorahexidine gluconate) soap before  surgery.  CHG is an antiseptic cleaner which kills germs and bonds with the skin to continue killing germs even after washing. Please DO NOT use if you have an allergy to CHG or antibacterial soaps.  If your skin becomes reddened/irritated stop using the CHG and inform your nurse when you arrive at Short Stay. Do not shave (including legs and underarms) for at least 48 hours prior to the first CHG shower.  You may shave your face/neck. Please follow these instructions carefully:  1.  Shower with CHG Soap the night before surgery and the  morning of Surgery.  2.  If you choose to wash your hair, wash your hair first as usual with your  normal  shampoo.  3.  After you shampoo, rinse your hair and body thoroughly to remove the  shampoo.                           4.  Use CHG as you would any other liquid soap.  You can apply chg directly  to the skin and wash                       Gently with a scrungie or clean washcloth.  5.  Apply the CHG Soap to your body ONLY FROM THE NECK DOWN.   Do not use on face/ open                           Wound or open sores. Avoid contact with eyes, ears mouth and genitals (private parts).                       Wash face,  Genitals (private parts) with your normal soap.             6.  Wash thoroughly, paying special attention to the area where your surgery  will be performed.  7.  Thoroughly rinse your body with warm water from the neck down.  8.  DO NOT shower/wash with your normal soap after using and rinsing off  the CHG Soap.                9.  Pat yourself dry with a clean towel.            10.  Wear clean pajamas.            11.  Place clean sheets on your bed the night of your first shower and do not  sleep with pets. Day of Surgery : Do not apply any lotions/deodorants the morning of surgery.  Please wear clean clothes to the hospital/surgery center.  FAILURE TO FOLLOW THESE INSTRUCTIONS MAY RESULT IN THE CANCELLATION OF YOUR SURGERY PATIENT  SIGNATURE_________________________________  NURSE SIGNATURE__________________________________  ________________________________________________________________________

## 2022-09-24 NOTE — Progress Notes (Signed)
Anesthesia Review:  PCP:Roy Fagan  Cardiologist : none  Chest x-ray : EKG : Echo : Stress test: Cardiac Cath :  Activity level: can do a flight of stairs without difficutly  Sleep Study/ CPAP : none  Fasting Blood Sugar :      / Checks Blood Sugar -- times a day:   Blood Thinner/ Instructions /Last Dose: ASA / Instructions/ Last Dose :

## 2022-09-25 ENCOUNTER — Other Ambulatory Visit: Payer: Self-pay

## 2022-09-25 ENCOUNTER — Encounter (HOSPITAL_COMMUNITY): Payer: Self-pay

## 2022-09-25 ENCOUNTER — Encounter (HOSPITAL_COMMUNITY)
Admission: RE | Admit: 2022-09-25 | Discharge: 2022-09-25 | Disposition: A | Discharge status: Home / Self Care | Payer: BC Managed Care – PPO | Source: Ambulatory Visit | Attending: Urology | Admitting: Urology

## 2022-09-30 NOTE — H&P (Signed)
Frequency, Nocturia and Urgency     09/18/22: [name] returns today in f/u to assess his response to silodosin. He is slighlty better but his IPSS is 22 with urgency, frequency and a reduced stream with nocturia x 3. His PVR is 25ml. His PF is 104ml/sec and the MF is 37ml/sec with voided.   07/15/22: [name] is a former patient of Dr. Wanda Plump. He had diverticulitis in 11/23 and was having some irritative symptoms with that. He has had increased nocturia 2-3x night. 10 day ago he fell skiing and may have cracked a rib. Now he has noticed his stream is much weaker. He has urgency and frequency with nocturia 4-5x. He has no dysuria. Over the past week, he doesn't feel he is emptying. His IPSS is 21. He has had no hematuria. He reports his PSA and rectal exam is normal. He takes Nyquil to aid sleeping 2-3x weekly for several years. He has had no GU surgery. He has had no UTI's or prostatitis. He has some issues with ED since his 48's. His PVR is 99ml.    CC: AUA Questions Scoring.  HPI: [name] is a 57 year-old male established patient who is here for evaluation.      AUA Symptom Score: More than 50% of the time he has the sensation of not emptying his bladder completely when finished urinating. More than 50% of the time he has to urinate again fewer than two hours after he has finished urinating. Less than 20% of the time he has to start and stop again several times when he urinates. Almost always he finds it difficult to postpone urination. More than 50% of the time he has a weak urinary stream. Less than 20% of the time he has to push or strain to begin urination. He has to get up to urinate 3 times from the time he goes to bed until the time he gets up in the morning.   Calculated AUA Symptom Score: 22    ALLERGIES: No Allergies    MEDICATIONS: Atorvastatin Calcium  Meloxicam  Restoril  Silodosin 8 mg capsule 1 capsule PO Daily  Tylenol  Vicks Nyquil     GU PSH: No GU PSH     NON-GU PSH: Inguinal hernia repair (laparoscopic), Right     GU PMH: BPH w/LUTS - 07/15/2022 ED due to arterial insufficiency, I discussed tadalafil for daily use but will hold off on that since he has preexisting tinnitus. - 07/15/2022 Incomplete bladder emptying - 07/15/2022 Nocturia - 07/15/2022 Urinary Frequency - 07/15/2022 Urinary Urgency - 07/15/2022 Weak Urinary Stream, He has progressive LUTS with a small prostate but some residual urine. I have recommended he stop the Nyquil and I will get him started on silodosin  daily since it is less likely to cause congestion. he will return in 6-8 weeks for a flowrate, PVR and possible cystoscopy. - 07/15/2022    NON-GU PMH: Hypercholesterolemia    FAMILY HISTORY: Atrial Fibrillation - Father Diabetes - Grandmother Hypertension - Father   SOCIAL HISTORY: Marital Status: Married Preferred Language: English Current Smoking Status: Patient does not smoke anymore. Has not smoked since 06/17/1998. Smoked for 20 years.   Tobacco Use Assessment Completed: Used Tobacco in last 30 days? Does not use smokeless tobacco. Does drink.  Drinks 3 caffeinated drinks per day. Patient's occupation Product/process development scientist.    REVIEW OF SYSTEMS:    GU Review Male:   Patient denies frequent urination, hard to postpone urination, burning/ pain with urination,  get up at night to urinate, leakage of urine, stream starts and stops, trouble starting your stream, have to strain to urinate , erection problems, and penile pain.  Gastrointestinal (Upper):   Patient denies nausea, vomiting, and indigestion/ heartburn.  Gastrointestinal (Lower):   Patient denies diarrhea and constipation.  Constitutional:   Patient denies fever, night sweats, weight loss, and fatigue.  Skin:   Patient denies itching and skin rash/ lesion.  Eyes:   Patient denies blurred vision and double vision.  Ears/ Nose/ Throat:   Patient denies sore throat and sinus problems.  Hematologic/Lymphatic:    Patient denies swollen glands and easy bruising.  Cardiovascular:   Patient denies leg swelling and chest pains.  Respiratory:   Patient denies cough and shortness of breath.  Endocrine:   Patient denies excessive thirst.  Musculoskeletal:   Patient denies back pain and joint pain.  Neurological:   Patient denies headaches and dizziness.  Psychologic:   Patient denies depression and anxiety.   VITAL SIGNS: None   MULTI-SYSTEM PHYSICAL EXAMINATION:    Constitutional: Well-nourished. No physical deformities. Normally developed. Good grooming.   Respiratory: Normal breath sounds. No labored breathing, no use of accessory muscles.   Cardiovascular: Regular rate and rhythm. No murmur, no gallop.      Complexity of Data:  Records Review:   Previous Patient Records  Urine Test Review:   Urinalysis  Urodynamics Review:   Review Bladder Scan, Review Flow Rate   PROCEDURES:         Flexible Cystoscopy - 52000  Risks, benefits, and some of the potential complications of the procedure were discussed. He was prepped with betadine and the urethral was instilled with 2% lidocaine jelly. Cipro  given for antibiotic prophylaxis.     Meatus:  Normal size. Normal location. Normal condition.  Urethra:  Severe bulbous stricture. that appears to be about 1cm in length. I couldn't get the scope by but could see past it a little bit.       I was unable to pass the scope beyond the stricture.   The procedure was well tolerated and there were no complications.        Flow Rate - 51741  Total Void Time: 0:30 min:sec  Peak Flow Rate: 4 cc/sec  Flow Time: 0:29 min:sec  Average Flow Rate: 3 cc/sec  Time of Peak Flow: 0:09 min:sec  Voided Volume: 113 cc           PVR Ultrasound - 16109  Scanned Volume: 25 cc         Urinalysis Dipstick Dipstick Cont'd  Color: Yellow Bilirubin: Neg mg/dL  Appearance: Clear Ketones: Neg mg/dL  Specific Gravity: 6.045 Blood: Neg ery/uL  pH: 7.5 Protein: Neg  mg/dL  Glucose: Neg mg/dL Urobilinogen: 0.2 mg/dL    Nitrites: Neg    Leukocyte Esterase: Neg leu/uL    ASSESSMENT:      ICD-10 Details  1 GU:   Bulbar urethral stricture - N35.011 Undiagnosed New Problem - He has a moderate to severe bulbar stricture that appears to be the likely cause of his symptoms. I will get him set up for an Optilume balloon dilation and reviewed the risks of bleeding, infection, injury to the urethra, recurrent stricture, need for a catheter, thrombotic events and anesthetic complications.  2   Weak Urinary Stream - R39.12 Chronic, Stable  3   Urinary Frequency - R35.0 Chronic, Stable   PLAN:           Schedule  Return Visit/Planned Activity: Next Available Appointment - Schedule Surgery  Procedure: Unspecified Date - Cysto Dilate Stricture (M or F) - 52281 Notes: next available.

## 2022-09-30 NOTE — Anesthesia Preprocedure Evaluation (Signed)
Anesthesia Evaluation  Patient identified by MRN, date of birth, ID band Patient awake    Reviewed: Allergy & Precautions, NPO status , Patient's Chart, lab work & pertinent test results  Airway Mallampati: II  TM Distance: >3 FB Neck ROM: Full    Dental no notable dental hx. (+) Dental Advisory Given, Teeth Intact   Pulmonary former smoker   Pulmonary exam normal breath sounds clear to auscultation       Cardiovascular negative cardio ROS  Rhythm:Regular Rate:Bradycardia     Neuro/Psych negative neurological ROS     GI/Hepatic negative GI ROS,,,(+)     substance abuse  marijuana use  Endo/Other  negative endocrine ROS    Renal/GU negative Renal ROS     Musculoskeletal negative musculoskeletal ROS (+)    Abdominal   Peds  Hematology negative hematology ROS (+)   Anesthesia Other Findings   Reproductive/Obstetrics                             Anesthesia Physical Anesthesia Plan  ASA: 2  Anesthesia Plan: General   Post-op Pain Management: Tylenol PO (pre-op)* and Minimal or no pain anticipated   Induction: Intravenous  PONV Risk Score and Plan: 3 and Ondansetron, Dexamethasone, Treatment may vary due to age or medical condition and Midazolam  Airway Management Planned: LMA  Additional Equipment:   Intra-op Plan:   Post-operative Plan: Extubation in OR  Informed Consent: I have reviewed the patients History and Physical, chart, labs and discussed the procedure including the risks, benefits and alternatives for the proposed anesthesia with the patient or authorized representative who has indicated his/her understanding and acceptance.     Dental advisory given  Plan Discussed with: CRNA  Anesthesia Plan Comments:        Anesthesia Quick Evaluation

## 2022-10-01 ENCOUNTER — Ambulatory Visit (HOSPITAL_COMMUNITY): Payer: BC Managed Care – PPO | Admitting: Anesthesiology

## 2022-10-01 ENCOUNTER — Encounter (HOSPITAL_COMMUNITY): Payer: Self-pay | Admitting: Urology

## 2022-10-01 ENCOUNTER — Encounter (HOSPITAL_COMMUNITY)
Admission: RE | Disposition: A | Discharge status: Home / Self Care | Payer: Self-pay | Source: Home / Self Care | Attending: Urology

## 2022-10-01 ENCOUNTER — Other Ambulatory Visit: Payer: Self-pay

## 2022-10-01 ENCOUNTER — Ambulatory Visit (HOSPITAL_COMMUNITY)
Admission: RE | Admit: 2022-10-01 | Discharge: 2022-10-01 | Disposition: A | Discharge status: Home / Self Care | Payer: BC Managed Care – PPO | Attending: Urology | Admitting: Urology

## 2022-10-01 ENCOUNTER — Ambulatory Visit (HOSPITAL_COMMUNITY): Payer: BC Managed Care – PPO

## 2022-10-01 DIAGNOSIS — N35912 Unspecified bulbous urethral stricture, male: Secondary | ICD-10-CM | POA: Diagnosis not present

## 2022-10-01 DIAGNOSIS — N5201 Erectile dysfunction due to arterial insufficiency: Secondary | ICD-10-CM | POA: Diagnosis not present

## 2022-10-01 DIAGNOSIS — R3912 Poor urinary stream: Secondary | ICD-10-CM | POA: Diagnosis not present

## 2022-10-01 DIAGNOSIS — Z87891 Personal history of nicotine dependence: Secondary | ICD-10-CM | POA: Insufficient documentation

## 2022-10-01 DIAGNOSIS — N35812 Other urethral bulbous stricture, male: Secondary | ICD-10-CM

## 2022-10-01 DIAGNOSIS — E7800 Pure hypercholesterolemia, unspecified: Secondary | ICD-10-CM | POA: Insufficient documentation

## 2022-10-01 DIAGNOSIS — F1220 Cannabis dependence, uncomplicated: Secondary | ICD-10-CM | POA: Diagnosis not present

## 2022-10-01 HISTORY — PX: CYSTOSCOPY WITH URETHRAL DILATATION: SHX5125

## 2022-10-01 SURGERY — CYSTOSCOPY, WITH URETHRAL DILATION
Anesthesia: General | Site: Urethra

## 2022-10-01 MED ORDER — ONDANSETRON HCL 4 MG/2ML IJ SOLN
INTRAMUSCULAR | Status: DC | PRN
  Administered 2022-10-01: 4 mg via INTRAVENOUS

## 2022-10-01 MED ORDER — DEXAMETHASONE SODIUM PHOSPHATE 10 MG/ML IJ SOLN
INTRAMUSCULAR | Status: DC | PRN
  Administered 2022-10-01: 5 mg via INTRAVENOUS

## 2022-10-01 MED ORDER — FENTANYL CITRATE (PF) 100 MCG/2ML IJ SOLN
INTRAMUSCULAR | Status: DC | PRN
  Administered 2022-10-01 (×2): 50 ug via INTRAVENOUS

## 2022-10-01 MED ORDER — HYDROCODONE-ACETAMINOPHEN 5-325 MG PO TABS: 1.0000 | ORAL_TABLET | Freq: Four times a day (QID) | ORAL | 0 refills | Status: DC | PRN

## 2022-10-01 MED ORDER — ORAL CARE MOUTH RINSE: 15.0000 mL | Freq: Once | OROMUCOSAL | Status: AC

## 2022-10-01 MED ORDER — DIATRIZOATE MEGLUMINE 30 % UR SOLN
URETHRAL | Status: AC
  Filled 2022-10-01: qty 100

## 2022-10-01 MED ORDER — LACTATED RINGERS IV SOLN: INTRAVENOUS | Status: DC

## 2022-10-01 MED ORDER — MIDAZOLAM HCL 5 MG/5ML IJ SOLN
INTRAMUSCULAR | Status: DC | PRN
  Administered 2022-10-01 (×2): 1 mg via INTRAVENOUS

## 2022-10-01 MED ORDER — EPHEDRINE SULFATE (PRESSORS) 50 MG/ML IJ SOLN
INTRAMUSCULAR | Status: DC | PRN
  Administered 2022-10-01: 5 mg via INTRAVENOUS

## 2022-10-01 MED ORDER — PROPOFOL 10 MG/ML IV BOLUS
INTRAVENOUS | Status: DC | PRN
  Administered 2022-10-01: 170 mg via INTRAVENOUS

## 2022-10-01 MED ORDER — LIDOCAINE HCL (CARDIAC) PF 100 MG/5ML IV SOSY
PREFILLED_SYRINGE | INTRAVENOUS | Status: DC | PRN
  Administered 2022-10-01: 60 mg via INTRAVENOUS

## 2022-10-01 MED ORDER — GLYCOPYRROLATE 0.2 MG/ML IJ SOLN
INTRAMUSCULAR | Status: DC | PRN
  Administered 2022-10-01: .2 mg via INTRAVENOUS

## 2022-10-01 MED ORDER — CEFAZOLIN SODIUM-DEXTROSE 2-4 GM/100ML-% IV SOLN
2.0000 g | INTRAVENOUS | Status: AC
  Administered 2022-10-01: 2 g via INTRAVENOUS

## 2022-10-01 MED ORDER — IOHEXOL 300 MG/ML  SOLN
INTRAMUSCULAR | Status: DC | PRN
  Administered 2022-10-01: 5 mL via URETHRAL

## 2022-10-01 MED ORDER — MEPERIDINE HCL 50 MG/ML IJ SOLN: 6.2500 mg | INTRAMUSCULAR | Status: DC | PRN

## 2022-10-01 MED ORDER — PROMETHAZINE HCL 25 MG/ML IJ SOLN: 6.2500 mg | INTRAMUSCULAR | Status: DC | PRN

## 2022-10-01 MED ORDER — CHLORHEXIDINE GLUCONATE 0.12 % MT SOLN
15.0000 mL | Freq: Once | OROMUCOSAL | Status: AC
  Administered 2022-10-01: 15 mL via OROMUCOSAL

## 2022-10-01 MED ORDER — FENTANYL CITRATE PF 50 MCG/ML IJ SOSY: 25.0000 ug | PREFILLED_SYRINGE | INTRAMUSCULAR | Status: DC | PRN

## 2022-10-01 MED ORDER — SODIUM CHLORIDE 0.9% FLUSH: 3.0000 mL | Freq: Two times a day (BID) | INTRAVENOUS | Status: DC

## 2022-10-01 MED ORDER — ACETAMINOPHEN 500 MG PO TABS
1000.0000 mg | ORAL_TABLET | Freq: Once | ORAL | Status: AC
  Administered 2022-10-01: 1000 mg via ORAL
  Filled 2022-10-01: qty 2

## 2022-10-01 MED ORDER — ATROPINE SULFATE 0.4 MG/ML IV SOLN
INTRAVENOUS | Status: DC | PRN
  Administered 2022-10-01: .2 mg via INTRAVENOUS

## 2022-10-01 MED ORDER — STERILE WATER FOR IRRIGATION IR SOLN
Status: DC | PRN
  Administered 2022-10-01: 3000 mL

## 2022-10-01 SURGICAL SUPPLY — 21 items
BAG DRN RND TRDRP ANRFLXCHMBR (UROLOGICAL SUPPLIES) ×1
BAG URINE DRAIN 2000ML AR STRL (UROLOGICAL SUPPLIES) IMPLANT
BAG URO CATCHER STRL LF (MISCELLANEOUS) ×2 IMPLANT
BALLN NEPHROSTOMY (BALLOONS) ×1
BALLN OPTILUME DCB 30X5X75 (BALLOONS) ×1
BALLOON NEPHROSTOMY (BALLOONS) IMPLANT
BALLOON OPTILUME DCB 30X5X75 (BALLOONS) IMPLANT
CATH FOLEY 2WAY SLVR  5CC 14FR (CATHETERS) ×1
CATH FOLEY 2WAY SLVR 5CC 14FR (CATHETERS) IMPLANT
CATH ROBINSON RED A/P 16FR (CATHETERS) IMPLANT
CATH URETL OPEN 5X70 (CATHETERS) IMPLANT
CLOTH BEACON ORANGE TIMEOUT ST (SAFETY) ×2 IMPLANT
GLOVE SURG SS PI 8.0 STRL IVOR (GLOVE) IMPLANT
GOWN STRL REUS W/ TWL XL LVL3 (GOWN DISPOSABLE) ×2 IMPLANT
GOWN STRL REUS W/TWL XL LVL3 (GOWN DISPOSABLE) ×1
GUIDEWIRE STR DUAL SENSOR (WIRE) ×2 IMPLANT
KIT TURNOVER KIT A (KITS) IMPLANT
MANIFOLD NEPTUNE II (INSTRUMENTS) ×2 IMPLANT
PACK CYSTO (CUSTOM PROCEDURE TRAY) ×2 IMPLANT
PENCIL SMOKE EVACUATOR (MISCELLANEOUS) IMPLANT
TUBING CONNECTING 10 (TUBING) ×2 IMPLANT

## 2022-10-01 NOTE — Transfer of Care (Signed)
Immediate Anesthesia Transfer of Care Note  Patient: [name]  Procedure(s) Performed: CYSTOSCOPY WITH OPTILIME BALLOON URETHRAL DILATATION (Urethra)  Patient Location: PACU  Anesthesia Type:General  Level of Consciousness: awake, alert , oriented, and patient cooperative  Airway & Oxygen Therapy: Patient Spontanous Breathing and Patient connected to face mask oxygen  Post-op Assessment: Report given to RN and Post -op Vital signs reviewed and stable  Post vital signs: Reviewed and stable  Last Vitals:  Vitals Value Taken Time  BP 130/96 10/01/22 1019  Temp    Pulse 67 10/01/22 1021  Resp 18 10/01/22 1021  SpO2 100 % 10/01/22 1021  Vitals shown include unvalidated device data.  Last Pain:  Vitals:   10/01/22 0806  TempSrc:   PainSc: 0-No pain      Patients Stated Pain Goal: 3 (10/01/22 0806)  Complications: No notable events documented.

## 2022-10-01 NOTE — Discharge Instructions (Addendum)
You may remove the catheter on Thursday morning using the instructions noted above.   Please contact the office if you don't feel comfortable removing it yourself.  Avoid sexual activity for 2 weeks and use a condom for 6 weeks to prevent transmission of the medication from the medicated optilume balloon.

## 2022-10-01 NOTE — Op Note (Signed)
Procedure: 1.  Cystoscopy with Optilume balloon dilation of bulbar urethral stricture. 2.  Application of fluoroscopy.  Preop diagnosis: Bulbar urethral stricture.  Postop diagnosis: Same.  Surgeon: Dr. Bjorn Pippin.  Anesthesia: General.  Specimen: None.  Drains: 14 French Foley catheter.  EBL: None.  Complications: None.  Indications: The patient is a 57 year old male with progressive voiding symptoms that failed to improve with an alpha-blocker.  He underwent cystoscopy in the office recently was found to have a severe short segment bulbar urethral stricture that was felt to require dilation.  Procedure: He was taken the operating room was given antibiotics.  A general anesthetic was induced.  He was placed in lithotomy position and fitted with PAS hose.  His perineum and genitalia were prepped with Betadine solution he was draped in usual sterile fashion.  Cystoscopy was performed using the 21 [place] scope and 30 degree lens.  Examination revealed a normal anterior urethra.  In the bulb there was a tight stricture that appeared to be about 6 [place] in diameter.  The cystoscope was replaced with the 6.5 French semirigid ureteroscope which was then advanced to the level of the stricture and a sensor wire was then advanced into the bladder.  I was able to advance the ureteroscope through the stricture over the wire and further inspection demonstrated normal urethra approximately 2 cm distal to the external sphincter.  The external sphincter was intact.  The prostatic urethra short with minimal lateral lobe hyperplasia.  Examination of bladder revealed mild trabeculation and on subsequent inspection with cystoscopy no tumors, stones or inflammation.  Ureteral orifices were unremarkable.  Initially I thought there were some calcifications present in the prostatic mucosa but on subsequent cystoscopy that were no longer identified.  The ureteroscope was removed and the 15 cm x 24 French  high-pressure balloon was advanced over the wire across the stricture under fluoroscopic guidance.  The balloon was inflated to 20 atm and left inflated for 3 minutes.  Fluoroscopy demonstrated disappearance of the waist.  The balloon was then deflated and removed.  The cystoscope was then reinserted through the stricture which was completely disrupted and the cystoscopic examination was completed as noted above.  The 5 cm 30 French Optilume balloon was then passed over the wire into the cystoscope and allowed to hydrate for 1 minute.  The balloon was then advanced through the stricture using both fluoroscopic and cystoscopic guidance and once it was in good position, the balloon was inflated to 10 atm.  The balloon was left inflated for 5 minutes and then deflated and removed.  The cystoscope and wire were removed and a 14 French Foley catheter was inserted without difficulty.  The balloon was inflated with 10 mL of sterile fluid and the catheter was irrigated with return of the very small clot followed by clear fluid.  The catheter was placed to straight drainage.  He was taken down from lithotomy position, his anesthetic was reversed and he was moved to cart room in stable condition.  There were no complications.

## 2022-10-01 NOTE — Interval H&P Note (Signed)
History and Physical Interval Note:  10/01/2022 9:03 AM  [name]  has presented today for surgery, with the diagnosis of BULBAR STRICTURE.  The various methods of treatment have been discussed with the patient and family. After consideration of risks, benefits and other options for treatment, the patient has consented to  Procedure(s) with comments: CYSTOSCOPY WITH OPTILIME BALLOON URETHRAL DILATATION (N/A) - 45 MINS FOR CASE as a surgical intervention.  The patient's history has been reviewed, patient examined, no change in status, stable for surgery.  I have reviewed the patient's chart and labs.  Questions were answered to the patient's satisfaction.     Bjorn Pippin

## 2022-10-01 NOTE — Anesthesia Procedure Notes (Signed)
Procedure Name: LMA Insertion Date/Time: 10/01/2022 9:34 AM  Performed by: Garth Bigness, CRNAPre-anesthesia Checklist: Patient identified, Emergency Drugs available, Suction available and Patient being monitored Patient Re-evaluated:Patient Re-evaluated prior to induction Oxygen Delivery Method: Circle system utilized Preoxygenation: Pre-oxygenation with 100% oxygen Induction Type: IV induction Ventilation: Mask ventilation without difficulty LMA: LMA with gastric port inserted LMA Size: 4.0 Tube type: Oral Number of attempts: 1 Placement Confirmation: positive ETCO2 and breath sounds checked- equal and bilateral Tube secured with: Tape Dental Injury: Teeth and Oropharynx as per pre-operative assessment

## 2022-10-01 NOTE — Anesthesia Postprocedure Evaluation (Signed)
Anesthesia Post Note  Patient: [name]  Procedure(s) Performed: CYSTOSCOPY WITH OPTILIME BALLOON URETHRAL DILATATION (Urethra)     Patient location during evaluation: PACU Anesthesia Type: General Level of consciousness: sedated and patient cooperative Pain management: pain level controlled Vital Signs Assessment: post-procedure vital signs reviewed and stable Respiratory status: spontaneous breathing Cardiovascular status: stable Anesthetic complications: no   No notable events documented.  Last Vitals:  Vitals:   10/01/22 1045 10/01/22 1100  BP: 122/80 133/88  Pulse: (!) 45 (!) 44  Resp: 10 12  Temp: (!) 36.4 C 36.4 C  SpO2: 100% 99%    Last Pain:  Vitals:   10/01/22 1100  TempSrc:   PainSc: 0-No pain                 Lewie Loron

## 2022-10-02 ENCOUNTER — Encounter (HOSPITAL_COMMUNITY): Payer: Self-pay | Admitting: Urology

## 2022-10-03 DIAGNOSIS — R3914 Feeling of incomplete bladder emptying: Secondary | ICD-10-CM | POA: Diagnosis not present

## 2022-10-11 DIAGNOSIS — R3914 Feeling of incomplete bladder emptying: Secondary | ICD-10-CM | POA: Diagnosis not present

## 2022-10-11 DIAGNOSIS — N401 Enlarged prostate with lower urinary tract symptoms: Secondary | ICD-10-CM | POA: Diagnosis not present

## 2022-10-11 DIAGNOSIS — R350 Frequency of micturition: Secondary | ICD-10-CM | POA: Diagnosis not present

## 2023-01-14 DIAGNOSIS — R109 Unspecified abdominal pain: Secondary | ICD-10-CM | POA: Diagnosis not present

## 2023-01-17 DIAGNOSIS — R8271 Bacteriuria: Secondary | ICD-10-CM | POA: Diagnosis not present

## 2023-01-17 DIAGNOSIS — N35011 Post-traumatic bulbous urethral stricture: Secondary | ICD-10-CM | POA: Diagnosis not present

## 2023-01-17 DIAGNOSIS — R300 Dysuria: Secondary | ICD-10-CM | POA: Diagnosis not present

## 2023-01-17 DIAGNOSIS — R3914 Feeling of incomplete bladder emptying: Secondary | ICD-10-CM | POA: Diagnosis not present

## 2023-01-22 DIAGNOSIS — M542 Cervicalgia: Secondary | ICD-10-CM | POA: Diagnosis not present

## 2023-01-22 DIAGNOSIS — M9901 Segmental and somatic dysfunction of cervical region: Secondary | ICD-10-CM | POA: Diagnosis not present

## 2023-01-22 DIAGNOSIS — M546 Pain in thoracic spine: Secondary | ICD-10-CM | POA: Diagnosis not present

## 2023-01-22 DIAGNOSIS — M9902 Segmental and somatic dysfunction of thoracic region: Secondary | ICD-10-CM | POA: Diagnosis not present

## 2023-01-29 DIAGNOSIS — M542 Cervicalgia: Secondary | ICD-10-CM | POA: Diagnosis not present

## 2023-01-29 DIAGNOSIS — M9902 Segmental and somatic dysfunction of thoracic region: Secondary | ICD-10-CM | POA: Diagnosis not present

## 2023-01-29 DIAGNOSIS — M546 Pain in thoracic spine: Secondary | ICD-10-CM | POA: Diagnosis not present

## 2023-01-29 DIAGNOSIS — M9901 Segmental and somatic dysfunction of cervical region: Secondary | ICD-10-CM | POA: Diagnosis not present

## 2023-02-03 DIAGNOSIS — M542 Cervicalgia: Secondary | ICD-10-CM | POA: Diagnosis not present

## 2023-02-03 DIAGNOSIS — M9901 Segmental and somatic dysfunction of cervical region: Secondary | ICD-10-CM | POA: Diagnosis not present

## 2023-02-03 DIAGNOSIS — M546 Pain in thoracic spine: Secondary | ICD-10-CM | POA: Diagnosis not present

## 2023-02-03 DIAGNOSIS — M9902 Segmental and somatic dysfunction of thoracic region: Secondary | ICD-10-CM | POA: Diagnosis not present

## 2023-02-04 DIAGNOSIS — M6740 Ganglion, unspecified site: Secondary | ICD-10-CM | POA: Diagnosis not present

## 2023-03-21 DIAGNOSIS — M62830 Muscle spasm of back: Secondary | ICD-10-CM | POA: Diagnosis not present

## 2023-03-21 DIAGNOSIS — M9903 Segmental and somatic dysfunction of lumbar region: Secondary | ICD-10-CM | POA: Diagnosis not present

## 2023-03-21 DIAGNOSIS — M9902 Segmental and somatic dysfunction of thoracic region: Secondary | ICD-10-CM | POA: Diagnosis not present

## 2023-03-21 DIAGNOSIS — M9905 Segmental and somatic dysfunction of pelvic region: Secondary | ICD-10-CM | POA: Diagnosis not present

## 2023-03-24 DIAGNOSIS — M62830 Muscle spasm of back: Secondary | ICD-10-CM | POA: Diagnosis not present

## 2023-03-24 DIAGNOSIS — M9903 Segmental and somatic dysfunction of lumbar region: Secondary | ICD-10-CM | POA: Diagnosis not present

## 2023-03-24 DIAGNOSIS — M9905 Segmental and somatic dysfunction of pelvic region: Secondary | ICD-10-CM | POA: Diagnosis not present

## 2023-03-24 DIAGNOSIS — M9902 Segmental and somatic dysfunction of thoracic region: Secondary | ICD-10-CM | POA: Diagnosis not present

## 2023-03-27 DIAGNOSIS — M9905 Segmental and somatic dysfunction of pelvic region: Secondary | ICD-10-CM | POA: Diagnosis not present

## 2023-03-27 DIAGNOSIS — M9903 Segmental and somatic dysfunction of lumbar region: Secondary | ICD-10-CM | POA: Diagnosis not present

## 2023-03-27 DIAGNOSIS — M62830 Muscle spasm of back: Secondary | ICD-10-CM | POA: Diagnosis not present

## 2023-03-27 DIAGNOSIS — M9902 Segmental and somatic dysfunction of thoracic region: Secondary | ICD-10-CM | POA: Diagnosis not present

## 2023-03-31 DIAGNOSIS — M62830 Muscle spasm of back: Secondary | ICD-10-CM | POA: Diagnosis not present

## 2023-03-31 DIAGNOSIS — M9905 Segmental and somatic dysfunction of pelvic region: Secondary | ICD-10-CM | POA: Diagnosis not present

## 2023-03-31 DIAGNOSIS — M9903 Segmental and somatic dysfunction of lumbar region: Secondary | ICD-10-CM | POA: Diagnosis not present

## 2023-03-31 DIAGNOSIS — M9902 Segmental and somatic dysfunction of thoracic region: Secondary | ICD-10-CM | POA: Diagnosis not present

## 2023-04-02 DIAGNOSIS — M9905 Segmental and somatic dysfunction of pelvic region: Secondary | ICD-10-CM | POA: Diagnosis not present

## 2023-04-02 DIAGNOSIS — M62830 Muscle spasm of back: Secondary | ICD-10-CM | POA: Diagnosis not present

## 2023-04-02 DIAGNOSIS — M9903 Segmental and somatic dysfunction of lumbar region: Secondary | ICD-10-CM | POA: Diagnosis not present

## 2023-04-02 DIAGNOSIS — M9902 Segmental and somatic dysfunction of thoracic region: Secondary | ICD-10-CM | POA: Diagnosis not present

## 2023-04-04 DIAGNOSIS — M65841 Other synovitis and tenosynovitis, right hand: Secondary | ICD-10-CM | POA: Diagnosis not present

## 2023-04-04 DIAGNOSIS — M1811 Unilateral primary osteoarthritis of first carpometacarpal joint, right hand: Secondary | ICD-10-CM | POA: Diagnosis not present

## 2023-04-17 DIAGNOSIS — K5780 Diverticulitis of intestine, part unspecified, with perforation and abscess without bleeding: Secondary | ICD-10-CM | POA: Diagnosis not present

## 2023-06-16 DIAGNOSIS — M9902 Segmental and somatic dysfunction of thoracic region: Secondary | ICD-10-CM | POA: Diagnosis not present

## 2023-06-16 DIAGNOSIS — M546 Pain in thoracic spine: Secondary | ICD-10-CM | POA: Diagnosis not present

## 2023-06-16 DIAGNOSIS — M542 Cervicalgia: Secondary | ICD-10-CM | POA: Diagnosis not present

## 2023-06-16 DIAGNOSIS — M9901 Segmental and somatic dysfunction of cervical region: Secondary | ICD-10-CM | POA: Diagnosis not present

## 2023-06-20 DIAGNOSIS — R7401 Elevation of levels of liver transaminase levels: Secondary | ICD-10-CM | POA: Diagnosis not present

## 2023-06-20 DIAGNOSIS — Z125 Encounter for screening for malignant neoplasm of prostate: Secondary | ICD-10-CM | POA: Diagnosis not present

## 2023-06-20 DIAGNOSIS — E785 Hyperlipidemia, unspecified: Secondary | ICD-10-CM | POA: Diagnosis not present

## 2023-06-20 DIAGNOSIS — Z79899 Other long term (current) drug therapy: Secondary | ICD-10-CM | POA: Diagnosis not present

## 2023-06-20 DIAGNOSIS — R7301 Impaired fasting glucose: Secondary | ICD-10-CM | POA: Diagnosis not present

## 2023-06-27 DIAGNOSIS — D179 Benign lipomatous neoplasm, unspecified: Secondary | ICD-10-CM | POA: Diagnosis not present

## 2023-06-27 DIAGNOSIS — R001 Bradycardia, unspecified: Secondary | ICD-10-CM | POA: Diagnosis not present

## 2023-06-27 DIAGNOSIS — R1032 Left lower quadrant pain: Secondary | ICD-10-CM | POA: Diagnosis not present

## 2023-06-27 DIAGNOSIS — Z0001 Encounter for general adult medical examination with abnormal findings: Secondary | ICD-10-CM | POA: Diagnosis not present

## 2023-06-27 DIAGNOSIS — R7303 Prediabetes: Secondary | ICD-10-CM | POA: Diagnosis not present

## 2023-06-27 DIAGNOSIS — I700 Atherosclerosis of aorta: Secondary | ICD-10-CM | POA: Diagnosis not present

## 2023-07-04 ENCOUNTER — Encounter: Payer: Self-pay | Admitting: *Deleted

## 2023-07-08 ENCOUNTER — Encounter (INDEPENDENT_AMBULATORY_CARE_PROVIDER_SITE_OTHER): Payer: Self-pay | Admitting: *Deleted

## 2023-07-11 DIAGNOSIS — R3915 Urgency of urination: Secondary | ICD-10-CM | POA: Diagnosis not present

## 2023-07-11 DIAGNOSIS — R3914 Feeling of incomplete bladder emptying: Secondary | ICD-10-CM | POA: Diagnosis not present

## 2023-07-11 DIAGNOSIS — N401 Enlarged prostate with lower urinary tract symptoms: Secondary | ICD-10-CM | POA: Diagnosis not present

## 2023-07-11 DIAGNOSIS — R350 Frequency of micturition: Secondary | ICD-10-CM | POA: Diagnosis not present

## 2023-07-25 ENCOUNTER — Other Ambulatory Visit: Payer: Self-pay | Admitting: *Deleted

## 2023-07-25 DIAGNOSIS — R222 Localized swelling, mass and lump, trunk: Secondary | ICD-10-CM

## 2023-07-28 ENCOUNTER — Encounter (INDEPENDENT_AMBULATORY_CARE_PROVIDER_SITE_OTHER): Payer: Self-pay | Admitting: Gastroenterology

## 2023-07-28 ENCOUNTER — Ambulatory Visit (INDEPENDENT_AMBULATORY_CARE_PROVIDER_SITE_OTHER): Payer: BC Managed Care – PPO | Admitting: Gastroenterology

## 2023-07-28 VITALS — BP 126/81 | HR 65 | Temp 97.3°F | Ht 70.0 in | Wt 193.3 lb

## 2023-07-28 DIAGNOSIS — R1012 Left upper quadrant pain: Secondary | ICD-10-CM | POA: Diagnosis not present

## 2023-07-28 DIAGNOSIS — R194 Change in bowel habit: Secondary | ICD-10-CM | POA: Diagnosis not present

## 2023-07-28 DIAGNOSIS — K625 Hemorrhage of anus and rectum: Secondary | ICD-10-CM | POA: Insufficient documentation

## 2023-07-28 DIAGNOSIS — K529 Noninfective gastroenteritis and colitis, unspecified: Secondary | ICD-10-CM | POA: Diagnosis not present

## 2023-07-28 MED ORDER — PEG 3350-KCL-NA BICARB-NACL 420 G PO SOLR: 4000.0000 mL | Freq: Once | ORAL | 0 refills | Status: AC

## 2023-07-28 NOTE — H&P (View-Only) (Signed)
 [name], M.D. Gastroenterology & Hepatology [facility] [address] [place], [place] [address] Primary Care Physician: Jorge Perches, MD [address] [place] [address]  Referring MD: PCP  Chief Complaint: Left upper quadrant abdominal pain  History of Present Illness: [name] is a 58 y.o. male with PMH previous diverticulitis, who presents for evaluation of left upper quadrant abdominal pain.  Patient states he has had intermittent episodes of diverticulitis since 2011. First episodes was significantly severe as he had a contained perforation. He has had episodes of diverticulitis once a year.  States last year he had a skiing accident and hit the left side of his lower anterior chest and upper abdomen, in the lower ara of the ribcage area. He thought he had a contusion in his abdomen. He reports that he had a new episode of diverticulitis in early November. He was empirically given Augmentin for 1 week by his PCP, did not have any imaging. States that after this episode, he felt recurrent intermittent discomfort of his LUQ.  He reports that he noticed having some worsening pain prior to having a bowel movement. States that he started taking more Metamucil which had decreased the frequency and severity of his symptoms. He reports that this does not feel as he used to be in the past.  Patient reports that he has 4-6 bowel movements per day, usually has a BM in the morning. He has had this issue for multiple years. He reports that the stool has normal consistency. He feels that he cannot completely empty his bowels. Due to his frequent bowel movements, he has frequent bleeding (tissue hematochezia) although he does not strain to move his bowels. No nighttime symptoms. Reports he has seen some occasional episodes of melena.  The patient denies having any vomiting, fever, chills, hematemesis, diarrhea, jaundice, pruritus or  weight loss. Very occasionally has nausea.  Most recent abdominal imaging was a CT of the abdomen and pelvis with IV contrast on 05/06/2022 which showed sigmoid diverticulitis without complication.  Last EGD:neg Last Colonoscopy:2022, no report available, patient states he had diverticulosis, possibly had a couple polyps  FHx: neg for any gastrointestinal/liver disease, no malignancies Social: neg smoking or illicit drug use. Drinks 1-2 beers/glasses of wine a day. Surgical: no abdominal surgeries  Past Medical History: Past Medical History:  Diagnosis Date   Diverticulitis    Low serum testosterone level 10/17/2011   tx. injections weekly    Past Surgical History: Past Surgical History:  Procedure Laterality Date   BUNIONECTOMY  10/22/2011   Procedure: Arbutus Leas;  Surgeon: Drucilla Schmidt, MD;  Location: [facility];  Service: Orthopedics;  Laterality: Right;   CYSTOSCOPY WITH URETHRAL DILATATION N/A 10/01/2022   Procedure: CYSTOSCOPY WITH OPTILIME BALLOON URETHRAL DILATATION;  Surgeon: Bjorn Pippin, MD;  Location: [facility];  Service: Urology;  Laterality: N/A;  26 MINS FOR CASE   HERNIA REPAIR  10-17-11   '00- RIH   MASS EXCISION  10/22/2011   Procedure: EXCISION MASS;  Surgeon: Drucilla Schmidt, MD;  Location: [facility];  Service: Orthopedics;  Laterality: Right;  Excision of a Pump Bump Right Foot [name] Bunionectomy   WISDOM TOOTH EXTRACTION  10-17-11   extracted as teenager    Family History: Family History  Problem Relation Age of Onset   Diabetes Mother        borderline   Atrial fibrillation Father    Diabetes Maternal Grandmother     Social History:  Social History   Tobacco Use  Smoking Status Former   Current packs/day: 0.00   Types: Cigarettes   Quit date: 10/16/2000   Years since quitting: 22.7  Smokeless Tobacco Never   Social History   Substance and Sexual Activity  Alcohol Use Yes   Comment: 1-2 days per week 1-2 drinks   Social History   Substance and  Sexual Activity  Drug Use Not Currently   Types: Marijuana   Comment: 1-2 times per month    Allergies: No Known Allergies  Medications: Current Outpatient Medications  Medication Sig Dispense Refill   Ascorbic Acid (VITAMIN C PO) Take 1 tablet by mouth daily.     atorvastatin (LIPITOR) 20 MG tablet Take 20 mg by mouth at bedtime.     Cholecalciferol (VITAMIN D3 PO) Take 1 tablet by mouth daily.     ibuprofen (ADVIL,MOTRIN) 200 MG tablet Take 400 mg by mouth every 6 (six) hours as needed for moderate pain.     Omega-3 Fatty Acids (OMEGA 3 PO) Take 1 capsule by mouth daily.     temazepam (RESTORIL) 15 MG capsule Take 15 mg by mouth at bedtime as needed for sleep.     No current facility-administered medications for this visit.    Review of Systems: GENERAL: negative for malaise, night sweats HEENT: No changes in hearing or vision, no nose bleeds or other nasal problems. NECK: Negative for lumps, goiter, pain and significant neck swelling RESPIRATORY: Negative for cough, wheezing CARDIOVASCULAR: Negative for chest pain, leg swelling, palpitations, orthopnea GI: SEE HPI MUSCULOSKELETAL: Negative for joint pain or swelling, back pain, and muscle pain. SKIN: Negative for lesions, rash PSYCH: Negative for sleep disturbance, mood disorder and recent psychosocial stressors. HEMATOLOGY Negative for prolonged bleeding, bruising easily, and swollen nodes. ENDOCRINE: Negative for cold or heat intolerance, polyuria, polydipsia and goiter. NEURO: negative for tremor, gait imbalance, syncope and seizures. The remainder of the review of systems is noncontributory.   Physical Exam: BP 126/81 (BP Location: Left Arm, Patient Position: Sitting, Cuff Size: Large)   Pulse 65   Temp (!) 97.3 F (36.3 C) (Temporal)   Ht 5\' 10"  (1.778 m)   Wt 193 lb 4.8 oz (87.7 kg)   BMI 27.74 kg/m  GENERAL: The patient is AO x3, in no acute distress. HEENT: Head is normocephalic and atraumatic. EOMI are  intact. Mouth is well hydrated and without lesions. NECK: Supple. No masses LUNGS: Clear to auscultation. No presence of rhonchi/wheezing/rales. Adequate chest expansion HEART: RRR, normal s1 and s2. ABDOMEN: Soft, nontender, no guarding, no peritoneal signs, and nondistended. BS +. No masses. EXTREMITIES: Without any cyanosis, clubbing, rash, lesions or edema. NEUROLOGIC: AOx3, no focal motor deficit. SKIN: no jaundice, no rashes   Imaging/Labs: as above  I personally reviewed and interpreted the available labs, imaging and endoscopic files.  Impression and Plan: [name] is a 58 y.o. male with PMH previous diverticulitis, who presents for evaluation of left upper quadrant abdominal pain.  Patient has presented some intermittent episodes of left subcostal abdominal discomfort along with chronic changes in her bowel movement frequency.  He denies having any other symptoms or red flag signs.  It is possible some of his symptoms are functional in nature primarily, but we will need to evaluate for his symptoms with an endoscopic evaluation with EGD and colonoscopy, as well as well as with serologies today.  He will benefit from increasing his fiber intake on a regular basis.  -Schedule EGD and colonoscopy -Can  continue taking Metamucil daily or switch to intake of Benefiber 1 tablespoon daily -Try to keep the perianal area clean, can try shaving this area as well If there is presence of internal hemorrhoids in colonoscopy, we will proceed with hemorrhoidal banding in the clinic -Check CMP, celiac disease panel and TSH  All questions were answered.      Jorge Blazing, MD Gastroenterology and Hepatology [facility]

## 2023-07-28 NOTE — Patient Instructions (Addendum)
 Schedule EGD and colonoscopy Can continue taking Metamucil daily or switch to intake of Benefiber 1 tablespoon daily Try to keep the perianal area clean, can try shaving this area as well If there is presence of internal hemorrhoids in colonoscopy, we will proceed with hemorrhoidal banding in the clinic Perform blood workup

## 2023-07-28 NOTE — Progress Notes (Signed)
 [name], M.D. Gastroenterology & Hepatology [facility] [address] [place], [place] [address] Primary Care Physician: Carylon Perches, MD [address] [place] [address]  Referring MD: PCP  Chief Complaint: Left upper quadrant abdominal pain  History of Present Illness: [name] is a 58 y.o. male with PMH previous diverticulitis, who presents for evaluation of left upper quadrant abdominal pain.  Patient states he has had intermittent episodes of diverticulitis since 2011. First episodes was significantly severe as he had a contained perforation. He has had episodes of diverticulitis once a year.  States last year he had a skiing accident and hit the left side of his lower anterior chest and upper abdomen, in the lower ara of the ribcage area. He thought he had a contusion in his abdomen. He reports that he had a new episode of diverticulitis in early November. He was empirically given Augmentin for 1 week by his PCP, did not have any imaging. States that after this episode, he felt recurrent intermittent discomfort of his LUQ.  He reports that he noticed having some worsening pain prior to having a bowel movement. States that he started taking more Metamucil which had decreased the frequency and severity of his symptoms. He reports that this does not feel as he used to be in the past.  Patient reports that he has 4-6 bowel movements per day, usually has a BM in the morning. He has had this issue for multiple years. He reports that the stool has normal consistency. He feels that he cannot completely empty his bowels. Due to his frequent bowel movements, he has frequent bleeding (tissue hematochezia) although he does not strain to move his bowels. No nighttime symptoms. Reports he has seen some occasional episodes of melena.  The patient denies having any vomiting, fever, chills, hematemesis, diarrhea, jaundice, pruritus or  weight loss. Very occasionally has nausea.  Most recent abdominal imaging was a CT of the abdomen and pelvis with IV contrast on 05/06/2022 which showed sigmoid diverticulitis without complication.  Last EGD:neg Last Colonoscopy:2022, no report available, patient states he had diverticulosis, possibly had a couple polyps  FHx: neg for any gastrointestinal/liver disease, no malignancies Social: neg smoking or illicit drug use. Drinks 1-2 beers/glasses of wine a day. Surgical: no abdominal surgeries  Past Medical History: Past Medical History:  Diagnosis Date   Diverticulitis    Low serum testosterone level 10/17/2011   tx. injections weekly    Past Surgical History: Past Surgical History:  Procedure Laterality Date   BUNIONECTOMY  10/22/2011   Procedure: Arbutus Leas;  Surgeon: Drucilla Schmidt, MD;  Location: [facility];  Service: Orthopedics;  Laterality: Right;   CYSTOSCOPY WITH URETHRAL DILATATION N/A 10/01/2022   Procedure: CYSTOSCOPY WITH OPTILIME BALLOON URETHRAL DILATATION;  Surgeon: Bjorn Pippin, MD;  Location: [facility];  Service: Urology;  Laterality: N/A;  26 MINS FOR CASE   HERNIA REPAIR  10-17-11   '00- RIH   MASS EXCISION  10/22/2011   Procedure: EXCISION MASS;  Surgeon: Drucilla Schmidt, MD;  Location: [facility];  Service: Orthopedics;  Laterality: Right;  Excision of a Pump Bump Right Foot Homero Fellers Bunionectomy   WISDOM TOOTH EXTRACTION  10-17-11   extracted as teenager    Family History: Family History  Problem Relation Age of Onset   Diabetes Mother        borderline   Atrial fibrillation Father    Diabetes Maternal Grandmother     Social History:  Social History   Tobacco Use  Smoking Status Former   Current packs/day: 0.00   Types: Cigarettes   Quit date: 10/16/2000   Years since quitting: 22.7  Smokeless Tobacco Never   Social History   Substance and Sexual Activity  Alcohol Use Yes   Comment: 1-2 days per week 1-2 drinks   Social History   Substance and  Sexual Activity  Drug Use Not Currently   Types: Marijuana   Comment: 1-2 times per month    Allergies: No Known Allergies  Medications: Current Outpatient Medications  Medication Sig Dispense Refill   Ascorbic Acid (VITAMIN C PO) Take 1 tablet by mouth daily.     atorvastatin (LIPITOR) 20 MG tablet Take 20 mg by mouth at bedtime.     Cholecalciferol (VITAMIN D3 PO) Take 1 tablet by mouth daily.     ibuprofen (ADVIL,MOTRIN) 200 MG tablet Take 400 mg by mouth every 6 (six) hours as needed for moderate pain.     Omega-3 Fatty Acids (OMEGA 3 PO) Take 1 capsule by mouth daily.     temazepam (RESTORIL) 15 MG capsule Take 15 mg by mouth at bedtime as needed for sleep.     No current facility-administered medications for this visit.    Review of Systems: GENERAL: negative for malaise, night sweats HEENT: No changes in hearing or vision, no nose bleeds or other nasal problems. NECK: Negative for lumps, goiter, pain and significant neck swelling RESPIRATORY: Negative for cough, wheezing CARDIOVASCULAR: Negative for chest pain, leg swelling, palpitations, orthopnea GI: SEE HPI MUSCULOSKELETAL: Negative for joint pain or swelling, back pain, and muscle pain. SKIN: Negative for lesions, rash PSYCH: Negative for sleep disturbance, mood disorder and recent psychosocial stressors. HEMATOLOGY Negative for prolonged bleeding, bruising easily, and swollen nodes. ENDOCRINE: Negative for cold or heat intolerance, polyuria, polydipsia and goiter. NEURO: negative for tremor, gait imbalance, syncope and seizures. The remainder of the review of systems is noncontributory.   Physical Exam: BP 126/81 (BP Location: Left Arm, Patient Position: Sitting, Cuff Size: Large)   Pulse 65   Temp (!) 97.3 F (36.3 C) (Temporal)   Ht 5\' 10"  (1.778 m)   Wt 193 lb 4.8 oz (87.7 kg)   BMI 27.74 kg/m  GENERAL: The patient is AO x3, in no acute distress. HEENT: Head is normocephalic and atraumatic. EOMI are  intact. Mouth is well hydrated and without lesions. NECK: Supple. No masses LUNGS: Clear to auscultation. No presence of rhonchi/wheezing/rales. Adequate chest expansion HEART: RRR, normal s1 and s2. ABDOMEN: Soft, nontender, no guarding, no peritoneal signs, and nondistended. BS +. No masses. EXTREMITIES: Without any cyanosis, clubbing, rash, lesions or edema. NEUROLOGIC: AOx3, no focal motor deficit. SKIN: no jaundice, no rashes   Imaging/Labs: as above  I personally reviewed and interpreted the available labs, imaging and endoscopic files.  Impression and Plan: [name] is a 58 y.o. male with PMH previous diverticulitis, who presents for evaluation of left upper quadrant abdominal pain.  Patient has presented some intermittent episodes of left subcostal abdominal discomfort along with chronic changes in her bowel movement frequency.  He denies having any other symptoms or red flag signs.  It is possible some of his symptoms are functional in nature primarily, but we will need to evaluate for his symptoms with an endoscopic evaluation with EGD and colonoscopy, as well as well as with serologies today.  He will benefit from increasing his fiber intake on a regular basis.  -Schedule EGD and colonoscopy -Can  continue taking Metamucil daily or switch to intake of Benefiber 1 tablespoon daily -Try to keep the perianal area clean, can try shaving this area as well If there is presence of internal hemorrhoids in colonoscopy, we will proceed with hemorrhoidal banding in the clinic -Check CMP, celiac disease panel and TSH  All questions were answered.      [name], MD Gastroenterology and Hepatology [facility]

## 2023-07-30 LAB — COMPREHENSIVE METABOLIC PANEL
AG Ratio: 1.9 (calc) (ref 1.0–2.5)
ALT: 27 U/L (ref 9–46)
AST: 18 U/L (ref 10–35)
Albumin: 4.5 g/dL (ref 3.6–5.1)
Alkaline phosphatase (APISO): 86 U/L (ref 35–144)
BUN: 13 mg/dL (ref 7–25)
CO2: 25 mmol/L (ref 20–32)
Calcium: 9.2 mg/dL (ref 8.6–10.3)
Chloride: 107 mmol/L (ref 98–110)
Creat: 0.82 mg/dL (ref 0.70–1.30)
Globulin: 2.4 g/dL (ref 1.9–3.7)
Glucose, Bld: 107 mg/dL — ABNORMAL HIGH (ref 65–99)
Potassium: 4.6 mmol/L (ref 3.5–5.3)
Sodium: 139 mmol/L (ref 135–146)
Total Bilirubin: 0.3 mg/dL (ref 0.2–1.2)
Total Protein: 6.9 g/dL (ref 6.1–8.1)

## 2023-07-30 LAB — CELIAC DISEASE PANEL
(tTG) Ab, IgA: <1 U/mL
(tTG) Ab, IgG: <1 U/mL
Gliadin IgA: 2.1 U/mL
Gliadin IgG: <1 U/mL
Immunoglobulin A: 185 mg/dL (ref 47–310)

## 2023-07-30 LAB — TSH: TSH: 0.7 m[IU]/L (ref 0.40–4.50)

## 2023-07-31 ENCOUNTER — Encounter: Payer: Self-pay | Admitting: General Surgery

## 2023-07-31 ENCOUNTER — Ambulatory Visit: Payer: BC Managed Care – PPO | Admitting: General Surgery

## 2023-07-31 VITALS — BP 120/78 | HR 56 | Temp 97.7°F | Resp 16 | Ht 70.0 in | Wt 196.0 lb

## 2023-07-31 DIAGNOSIS — R222 Localized swelling, mass and lump, trunk: Secondary | ICD-10-CM | POA: Diagnosis not present

## 2023-07-31 NOTE — Patient Instructions (Signed)
Lipoma Removal  Lipoma removal is a surgical procedure to remove a lipoma, which is a noncancerous (benign) tumor that is made up of fat cells. Most lipomas are small and painless and do not require treatment. They can form in many areas of the body but are most common under the skin of the back, arms, shoulders, buttocks, and thighs. You may need lipoma removal if you have a lipoma that is large, growing, or causing discomfort. Lipoma removal may also be done for cosmetic reasons. Tell a health care provider about: Any allergies you have. All medicines you are taking, including vitamins, herbs, eye drops, creams, and over-the-counter medicines. Any problems you or family members have had with anesthetic medicines. Any bleeding problems you have. Any surgeries you have had. Any medical conditions you have. Whether you are pregnant or may be pregnant. What are the risks? Generally, this is a safe procedure. However, problems may occur, including: Infection. Bleeding. Scarring. Allergic reactions to medicines. Damage to nearby structures or organs, such as damage to nerves or blood vessels near the lipoma. What happens before the procedure? Medicines Ask your health care provider about: Changing or stopping your regular medicines. This is especially important if you are taking diabetes medicines or blood thinners. Taking medicines such as aspirin and ibuprofen. These medicines can thin your blood. Do not take these medicines unless your health care provider tells you to take them. Taking over-the-counter medicines, vitamins, herbs, and supplements. General instructions You will have a physical exam. Your health care provider will check the size of the lipoma and whether it can be removed easily. You may have a biopsy and imaging tests, such as X-rays, a CT scan, and an MRI. Do not use any products that contain nicotine or tobacco for at least 4 weeks before the procedure. These products  include cigarettes, chewing tobacco, and vaping devices, such as e-cigarettes. If you need help quitting, ask your health care provider. Ask your health care provider: How your surgery site will be marked. What steps will be taken to help prevent infection. These may include: Washing skin with a germ-killing soap. Taking antibiotic medicine. If you will be going home right after the procedure, plan to have a responsible adult: Take you home from the hospital or clinic. You will not be allowed to drive. Care for you for the time you are told. What happens during the procedure?  An IV will be inserted into one of your veins. You will be given one or more of the following: A medicine to help you relax (sedative). A medicine to numb the area (local anesthetic). A medicine to make you fall asleep (general anesthetic). A medicine that is injected into an area of your body to numb everything below the injection site (regional anesthetic). An incision will be made into the skin over the lipoma or very near the lipoma. The incision may be made in a natural skin line or crease. Tissues, nerves, and blood vessels near the lipoma will be moved out of the way. The lipoma and the capsule that surrounds it will be separated from the surrounding tissues. The lipoma will be removed. The incision may be closed with stitches (sutures). A bandage (dressing) will be placed over the incision. The procedure may vary among health care providers and hospitals. What happens after the procedure? Your blood pressure, heart rate, breathing rate, and blood oxygen level will be monitored until you leave the hospital or clinic. If you were prescribed an antibiotic medicine,  use it as told by your health care provider. Do not stop using the antibiotic even if you start to feel better. If you were given a sedative during the procedure, it can affect you for several hours. Do not drive or operate machinery until your health  care provider says that it is safe. Where to find more information OrthoInfo: orthoinfo.aaos.org Summary Before the procedure, follow instructions from your health care provider about eating and drinking, and changing or stopping your regular medicines. This is especially important if you are taking diabetes medicines or blood thinners. After the lipoma is removed, the incision may be closed with stitches (sutures) and covered with a bandage (dressing). If you were given a sedative during the procedure, it can affect you for several hours. Do not drive or operate machinery until your health care provider says that it is safe. This information is not intended to replace advice given to you by your health care provider. Make sure you discuss any questions you have with your health care provider. Document Revised: 06/22/2021 Document Reviewed: 06/22/2021 Elsevier Patient Education  2024 [employer].

## 2023-07-31 NOTE — Progress Notes (Unsigned)
[facility] History and Physical  Reason for Referral:*** Referring Physician: ***  Chief Complaint   New Patient (Initial Visit)     [name] is a 58 y.o. male.  HPI: ***.  The *** started *** and has had a duration of ***.  It is associated with ***.  The *** is improved with ***, and is made worse with ***.    Quality*** Context***  Past Medical History:  Diagnosis Date  . Diverticulitis   . Low serum testosterone level 10/17/2011   tx. injections weekly    Past Surgical History:  Procedure Laterality Date  . BUNIONECTOMY  10/22/2011   Procedure: Arbutus Leas;  Surgeon: Drucilla Schmidt, MD;  Location: [facility];  Service: Orthopedics;  Laterality: Right;  . CYSTOSCOPY WITH URETHRAL DILATATION N/A 10/01/2022   Procedure: CYSTOSCOPY WITH OPTILIME BALLOON URETHRAL DILATATION;  Surgeon: Bjorn Pippin, MD;  Location: [facility];  Service: Urology;  Laterality: N/A;  45 MINS FOR CASE  . HERNIA REPAIR  10-17-11   '00- RIH  . MASS EXCISION  10/22/2011   Procedure: EXCISION MASS;  Surgeon: Drucilla Schmidt, MD;  Location: [facility];  Service: Orthopedics;  Laterality: Right;  Excision of a Pump Bump Right Foot Frank Bunionectomy  . WISDOM TOOTH EXTRACTION  10-17-11   extracted as teenager    Family History  Problem Relation Age of Onset  . Diabetes Mother        borderline  . Atrial fibrillation Father   . Diabetes Maternal Grandmother     Social History   Tobacco Use  . Smoking status: Former    Current packs/day: 0.00    Types: Cigarettes    Quit date: 10/16/2000    Years since quitting: 22.8  . Smokeless tobacco: Never  Vaping Use  . Vaping status: Never Used  Substance Use Topics  . Alcohol use: Yes    Comment: 1-2 days per week 1-2 drinks  . Drug use: Not Currently    Types: Marijuana    Comment: 1-2 times per month    Medications: {medication reviewed/display:3041432} Allergies as of 07/31/2023   No Known Allergies      Medication List         Accurate as of July 31, 2023  1:09 PM. If you have any questions, ask your nurse or doctor.          atorvastatin 20 MG tablet Commonly known as: LIPITOR Take 20 mg by mouth at bedtime.   ibuprofen 200 MG tablet Commonly known as: ADVIL Take 400 mg by mouth every 6 (six) hours as needed for moderate pain.   OMEGA 3 PO Take 1 capsule by mouth daily.   temazepam 15 MG capsule Commonly known as: RESTORIL Take 15 mg by mouth at bedtime as needed for sleep.   VITAMIN C PO Take 1 tablet by mouth daily.   VITAMIN D3 PO Take 1 tablet by mouth daily.         ROS:  {Review of Systems:30496}  Blood pressure 120/78, pulse (!) 56, temperature 97.7 F (36.5 C), temperature source Oral, resp. rate 16, height 5\' 10"  (1.778 m), weight 196 lb (88.9 kg), SpO2 96%. Physical Exam  Results: No results found for this or any previous visit (from the past 48 hours).  No results found.   Assessment & Plan:  [name] is a 58 y.o. male with *** -*** -*** -Follow up ***  All questions were answered to the satisfaction of the patient  and family***.  The risk and benefits of *** were discussed including but not limited to ***.  After careful consideration, [name] has decided to ***.    Lucretia Roers 07/31/2023, 1:09 PM

## 2023-08-20 ENCOUNTER — Encounter (INDEPENDENT_AMBULATORY_CARE_PROVIDER_SITE_OTHER): Payer: Self-pay | Admitting: *Deleted

## 2023-08-20 ENCOUNTER — Ambulatory Visit (HOSPITAL_COMMUNITY): Admitting: Anesthesiology

## 2023-08-20 ENCOUNTER — Other Ambulatory Visit: Payer: Self-pay

## 2023-08-20 ENCOUNTER — Ambulatory Visit (HOSPITAL_COMMUNITY)
Admission: RE | Admit: 2023-08-20 | Discharge: 2023-08-20 | Disposition: A | Discharge status: Home / Self Care | Payer: BC Managed Care – PPO | Attending: Gastroenterology | Admitting: Gastroenterology

## 2023-08-20 ENCOUNTER — Encounter (HOSPITAL_COMMUNITY)
Admission: RE | Disposition: A | Discharge status: Home / Self Care | Payer: Self-pay | Source: Home / Self Care | Attending: Gastroenterology

## 2023-08-20 ENCOUNTER — Encounter (HOSPITAL_COMMUNITY): Payer: Self-pay | Admitting: Gastroenterology

## 2023-08-20 DIAGNOSIS — Z860100 Personal history of colon polyps, unspecified: Secondary | ICD-10-CM

## 2023-08-20 DIAGNOSIS — D122 Benign neoplasm of ascending colon: Secondary | ICD-10-CM | POA: Insufficient documentation

## 2023-08-20 DIAGNOSIS — Z87891 Personal history of nicotine dependence: Secondary | ICD-10-CM | POA: Insufficient documentation

## 2023-08-20 DIAGNOSIS — K449 Diaphragmatic hernia without obstruction or gangrene: Secondary | ICD-10-CM | POA: Insufficient documentation

## 2023-08-20 DIAGNOSIS — K625 Hemorrhage of anus and rectum: Secondary | ICD-10-CM

## 2023-08-20 DIAGNOSIS — R109 Unspecified abdominal pain: Secondary | ICD-10-CM | POA: Diagnosis not present

## 2023-08-20 DIAGNOSIS — I10 Essential (primary) hypertension: Secondary | ICD-10-CM | POA: Diagnosis not present

## 2023-08-20 DIAGNOSIS — K5730 Diverticulosis of large intestine without perforation or abscess without bleeding: Secondary | ICD-10-CM | POA: Diagnosis not present

## 2023-08-20 DIAGNOSIS — R1012 Left upper quadrant pain: Secondary | ICD-10-CM | POA: Diagnosis not present

## 2023-08-20 DIAGNOSIS — K648 Other hemorrhoids: Secondary | ICD-10-CM | POA: Insufficient documentation

## 2023-08-20 DIAGNOSIS — K644 Residual hemorrhoidal skin tags: Secondary | ICD-10-CM | POA: Diagnosis not present

## 2023-08-20 DIAGNOSIS — D123 Benign neoplasm of transverse colon: Secondary | ICD-10-CM | POA: Insufficient documentation

## 2023-08-20 DIAGNOSIS — R197 Diarrhea, unspecified: Secondary | ICD-10-CM | POA: Diagnosis not present

## 2023-08-20 DIAGNOSIS — K529 Noninfective gastroenteritis and colitis, unspecified: Secondary | ICD-10-CM | POA: Insufficient documentation

## 2023-08-20 DIAGNOSIS — D124 Benign neoplasm of descending colon: Secondary | ICD-10-CM | POA: Diagnosis not present

## 2023-08-20 DIAGNOSIS — K635 Polyp of colon: Secondary | ICD-10-CM | POA: Diagnosis not present

## 2023-08-20 HISTORY — PX: POLYPECTOMY: SHX5525

## 2023-08-20 HISTORY — PX: ESOPHAGOGASTRODUODENOSCOPY (EGD) WITH PROPOFOL: SHX5813

## 2023-08-20 HISTORY — PX: SCLEROTHERAPY: SHX6841

## 2023-08-20 HISTORY — PX: COLONOSCOPY WITH PROPOFOL: SHX5780

## 2023-08-20 LAB — HM COLONOSCOPY

## 2023-08-20 SURGERY — COLONOSCOPY WITH PROPOFOL
Anesthesia: General

## 2023-08-20 MED ORDER — LACTATED RINGERS IV SOLN: INTRAVENOUS | Status: DC

## 2023-08-20 MED ORDER — PROPOFOL 500 MG/50ML IV EMUL
INTRAVENOUS | Status: DC | PRN
  Administered 2023-08-20: 200 ug/kg/min via INTRAVENOUS

## 2023-08-20 MED ORDER — HYDROCORTISONE ACETATE 25 MG RE SUPP: 25.0000 mg | Freq: Two times a day (BID) | RECTAL | 0 refills | Status: AC

## 2023-08-20 MED ORDER — LIDOCAINE HCL (CARDIAC) PF 100 MG/5ML IV SOSY
PREFILLED_SYRINGE | INTRAVENOUS | Status: DC | PRN
Start: 2023-08-20 — End: 2023-08-20
  Administered 2023-08-20: 50 mg via INTRAVENOUS

## 2023-08-20 MED ORDER — PROPOFOL 10 MG/ML IV BOLUS
INTRAVENOUS | Status: DC | PRN
  Administered 2023-08-20: 100 mg via INTRAVENOUS

## 2023-08-20 NOTE — Op Note (Signed)
 [facility] Patient Name: [name] Procedure Date: 08/20/2023 10:03 AM MRN: [id number] Date of Birth: April 30, 1966 Attending MD: [name] , , [id number] CSN: [id number] Age: 58 Admit Type: Outpatient Procedure:                Upper GI endoscopy Indications:              Abdominal pain, Diarrhea Providers:                [name], Edrick Kins, RN, Elinor Parkinson Referring MD:              Medicines:                Monitored Anesthesia Care Complications:            No immediate complications. Estimated Blood Loss:     Estimated blood loss: none. Procedure:                Pre-Anesthesia Assessment:                           - Prior to the procedure, a History and Physical                            was performed, and patient medications, allergies                            and sensitivities were reviewed. The patient's                            tolerance of previous anesthesia was reviewed.                           - The risks and benefits of the procedure and the                            sedation options and risks were discussed with the                            patient. All questions were answered and informed                            consent was obtained.                           - ASA Grade Assessment: II - A patient with mild                            systemic disease.                           After obtaining informed consent, the endoscope was                            passed under direct vision. Throughout the                            procedure, the patient's blood  pressure, pulse, and                            oxygen saturations were monitored continuously. The                            [id number]-[id number] ([id number]) scope was introduced through the                            mouth, and advanced to the second part of duodenum.                            The upper GI endoscopy was accomplished without                            difficulty. The  patient tolerated the procedure                            well. Scope In: 10:19:17 AM Scope Out: 10:22:46 AM Total Procedure Duration: 0 hours 3 minutes 29 seconds  Findings:      A 1 cm hiatal hernia was present.      The gastroesophageal flap valve was visualized endoscopically and       classified as Hill Grade II (fold present, opens with respiration).      The stomach was normal.      The examined duodenum was normal. Biopsies were taken with a cold       forceps for histology. Impression:               - 1 cm hiatal hernia.                           - Normal stomach.                           - Normal examined duodenum. Biopsied. Moderate Sedation:      Per Anesthesia Care Recommendation:           - Discharge patient to home (ambulatory).                           - Resume previous diet.                           - Await pathology results. Procedure Code(s):        --- Professional ---                           [phone], Esophagogastroduodenoscopy, flexible,                            transoral; with biopsy, single or multiple Diagnosis Code(s):        --- Professional ---                           K44.9, Diaphragmatic hernia without obstruction or  gangrene                           R10.9, Unspecified abdominal pain                           R19.7, Diarrhea, unspecified CPT copyright 2022 American Medical Association. All rights reserved. The codes documented in this report are preliminary and upon coder review may  be revised to meet current compliance requirements. [name], MD [name],  08/20/2023 10:25:06 AM This report has been signed electronically. Number of Addenda: 0

## 2023-08-20 NOTE — Interval H&P Note (Signed)
 History and Physical Interval Note:  08/20/2023 9:23 AM  [name]  has presented today for surgery, with the diagnosis of LUQ PAIN, CHRONIC DIARRHEA, RECTAL BLEEDING.  The various methods of treatment have been discussed with the patient and family. After consideration of risks, benefits and other options for treatment, the patient has consented to  Procedure(s) with comments: COLONOSCOPY WITH PROPOFOL (N/A) - 11:15AM;ASA 1 ESOPHAGOGASTRODUODENOSCOPY (EGD) WITH PROPOFOL (N/A) - 11:15AM;ASA 1 as a surgical intervention.  The patient's history has been reviewed, patient examined, no change in status, stable for surgery.  I have reviewed the patient's chart and labs.  Questions were answered to the patient's satisfaction.     [name] Mayorga

## 2023-08-20 NOTE — Discharge Instructions (Signed)
 You are being discharged to home.  Resume your previous diet.  We are waiting for your pathology results.  Your physician has recommended a repeat colonoscopy in three years for surveillance.  If recurrent bleeding, use Anusol suppositories every 12 hours for 5-7 days

## 2023-08-20 NOTE — Op Note (Signed)
 [facility] Patient Name: [name] Procedure Date: 08/20/2023 9:59 AM MRN: [id number] Date of Birth: 10-17-1965 Attending MD: [name] , , [id number] CSN: [id number] Age: 58 Admit Type: Outpatient Procedure:                Colonoscopy Indications:              Abdominal pain, Clinically significant diarrhea of                            unexplained origin Providers:                [name], Edrick Kins, RN, Elinor Parkinson Referring MD:              Medicines:                Monitored Anesthesia Care Complications:            No immediate complications. Estimated Blood Loss:     Estimated blood loss: none. Procedure:                Pre-Anesthesia Assessment:                           - Prior to the procedure, a History and Physical                            was performed, and patient medications, allergies                            and sensitivities were reviewed. The patient's                            tolerance of previous anesthesia was reviewed.                           - The risks and benefits of the procedure and the                            sedation options and risks were discussed with the                            patient. All questions were answered and informed                            consent was obtained.                           - ASA Grade Assessment: II - A patient with mild                            systemic disease.                           After obtaining informed consent, the colonoscope                            was passed under direct vision. Throughout the  procedure, the patient's blood pressure, pulse, and                            oxygen saturations were monitored continuously. The                            [id number]-[id number] ([id number]) scope was introduced through                            the anus and advanced to the the terminal ileum.                            The colonoscopy was performed  without difficulty.                            The patient tolerated the procedure well. The                            quality of the bowel preparation was excellent. Scope In: 10:26:34 AM Scope Out: 10:51:35 AM Scope Withdrawal Time: 0 hours 23 minutes 1 second  Total Procedure Duration: 0 hours 25 minutes 1 second  Findings:      Small external hemorrhoids were found on perianal exam.      The terminal ileum appeared normal.      Two sessile polyps were found in the transverse colon and ascending       colon. The polyps were 3 to 5 mm in size. These polyps were removed with       a cold snare. Resection and retrieval were complete.      A 10 mm polyp was found in the transverse colon. The polyp was flat.       Area was successfully injected with 1.2 mL Eleview for a lift       polypectomy. Imaging was performed using white light and narrow band       imaging to visualize the mucosa and demarcate the polyp site after       injection for EMR purposes. The polyp was removed with a cold snare.       Resection and retrieval were complete.      A 3 mm polyp was found in the descending colon. The polyp was sessile.       The polyp was removed with a cold snare. Resection and retrieval were       complete.      Scattered small-mouthed diverticula were found in the sigmoid colon and       descending colon. Biopsies for histology were taken from normal colon       with a cold forceps from the right colon and left colon for evaluation       of microscopic colitis.      Non-bleeding internal hemorrhoids were found during retroflexion. The       hemorrhoids were medium-sized. Impression:               - Hemorrhoids found on perianal exam.                           - The examined portion of the ileum was normal.                           -  Two 3 to 5 mm polyps in the transverse colon and                            in the ascending colon, removed with a cold snare.                             Resected and retrieved.                           - One 10 mm polyp in the transverse colon, removed                            with a cold snare. Resected and retrieved via EMR.                           - One 3 mm polyp in the descending colon, removed                            with a cold snare. Resected and retrieved.                           - Diverticulosis in the sigmoid colon and in the                            descending colon. Biopsied.                           - Non-bleeding internal hemorrhoids. Moderate Sedation:      Per Anesthesia Care Recommendation:           - Discharge patient to home (ambulatory).                           - Resume previous diet.                           - Await pathology results.                           - Repeat colonoscopy in 3 years for surveillance.                           - If recurrent bleeding, use Anusol suppositories                            every 12 hours for 5-7 days Procedure Code(s):        --- Professional ---                           [phone], 59, Colonoscopy, flexible; with removal of                            tumor(s), polyp(s), or other lesion(s) by snare  technique                           [id number], 59, Colonoscopy, flexible; with biopsy,                            single or multiple                           [id number], Colonoscopy, flexible; with directed                            submucosal injection(s), any substance Diagnosis Code(s):        --- Professional ---                           K64.8, Other hemorrhoids                           D12.3, Benign neoplasm of transverse colon (hepatic                            flexure or splenic flexure)                           D12.2, Benign neoplasm of ascending colon                           D12.4, Benign neoplasm of descending colon                           R10.9, Unspecified abdominal pain                           R19.7, Diarrhea, unspecified                            K57.30, Diverticulosis of large intestine without                            perforation or abscess without bleeding CPT copyright 2022 American Medical Association. All rights reserved. The codes documented in this report are preliminary and upon coder review may  be revised to meet current compliance requirements. [name], MD [name],  08/20/2023 11:02:30 AM This report has been signed electronically. Number of Addenda: 0

## 2023-08-20 NOTE — Transfer of Care (Signed)
 Immediate Anesthesia Transfer of Care Note  Patient: [name]  Procedure(s) Performed: COLONOSCOPY WITH PROPOFOL ESOPHAGOGASTRODUODENOSCOPY (EGD) WITH PROPOFOL POLYPECTOMY  Patient Location: Endoscopy Unit  Anesthesia Type:General  Level of Consciousness: awake  Airway & Oxygen Therapy: Patient Spontanous Breathing  Post-op Assessment: Report given to RN and Post -op Vital signs reviewed and stable  Post vital signs: Reviewed and stable  Last Vitals:  Vitals Value Taken Time  BP    Temp    Pulse    Resp    SpO2      Last Pain:  Vitals:   08/20/23 1013  TempSrc:   PainSc: 0-No pain      Patients Stated Pain Goal: 4 (08/20/23 0934)  Complications: No notable events documented.

## 2023-08-20 NOTE — Anesthesia Preprocedure Evaluation (Signed)

## 2023-08-21 ENCOUNTER — Encounter (HOSPITAL_COMMUNITY): Payer: Self-pay | Admitting: Gastroenterology

## 2023-08-21 LAB — SURGICAL PATHOLOGY

## 2023-08-21 NOTE — Anesthesia Postprocedure Evaluation (Signed)
 Anesthesia Post Note  Patient: [name]  Procedure(s) Performed: COLONOSCOPY WITH PROPOFOL ESOPHAGOGASTRODUODENOSCOPY (EGD) WITH PROPOFOL POLYPECTOMY SCLEROTHERAPY  Patient location during evaluation: Phase II Anesthesia Type: General Level of consciousness: awake Pain management: pain level controlled Vital Signs Assessment: post-procedure vital signs reviewed and stable Respiratory status: spontaneous breathing and respiratory function stable Cardiovascular status: blood pressure returned to baseline and stable Postop Assessment: no headache and no apparent nausea or vomiting Anesthetic complications: no Comments: Late entry   No notable events documented.   Last Vitals:  Vitals:   08/20/23 0934 08/20/23 1058  BP: (!) 127/93 96/70  Pulse: (!) 51 65  Resp: 14 16  Temp: 36.5 C 36.5 C  SpO2: 99% 100%    Last Pain:  Vitals:   08/20/23 1058  TempSrc: Axillary  PainSc: 0-No pain                 Windell Norfolk

## 2023-08-22 DIAGNOSIS — N35011 Post-traumatic bulbous urethral stricture: Secondary | ICD-10-CM | POA: Diagnosis not present

## 2023-08-22 DIAGNOSIS — N401 Enlarged prostate with lower urinary tract symptoms: Secondary | ICD-10-CM | POA: Diagnosis not present

## 2023-08-22 DIAGNOSIS — R3915 Urgency of urination: Secondary | ICD-10-CM | POA: Diagnosis not present

## 2023-08-22 DIAGNOSIS — R3912 Poor urinary stream: Secondary | ICD-10-CM | POA: Diagnosis not present

## 2023-08-25 ENCOUNTER — Encounter (INDEPENDENT_AMBULATORY_CARE_PROVIDER_SITE_OTHER): Payer: Self-pay | Admitting: *Deleted

## 2023-09-10 ENCOUNTER — Ambulatory Visit: Admitting: General Surgery

## 2023-09-10 ENCOUNTER — Telehealth: Payer: Self-pay | Admitting: *Deleted

## 2023-09-10 NOTE — Telephone Encounter (Signed)
 Patient came to office at 2:50pm for appointment that was moved.   Patient stated that he was not aware that appointment had been moved.   Front office staff called patient when appointment was re-scheduled and received approval to change time. Patient was called to confirm appointment. Patient also called office to confirm time of appointment earlier this week.   Patient was made aware by both writer and fellow front office staff of appointment time change that patient was agreeable to.   Patient declined to reschedule at this time.

## 2023-09-11 ENCOUNTER — Ambulatory Visit: Payer: BC Managed Care – PPO | Admitting: General Surgery

## 2023-10-07 ENCOUNTER — Encounter (INDEPENDENT_AMBULATORY_CARE_PROVIDER_SITE_OTHER): Payer: Self-pay | Admitting: Gastroenterology

## 2023-11-12 ENCOUNTER — Ambulatory Visit (INDEPENDENT_AMBULATORY_CARE_PROVIDER_SITE_OTHER): Admitting: Gastroenterology

## 2023-11-12 ENCOUNTER — Encounter: Payer: Self-pay | Admitting: Gastroenterology

## 2023-11-12 VITALS — BP 151/86 | HR 54 | Temp 97.1°F | Ht 71.5 in | Wt 186.7 lb

## 2023-11-12 DIAGNOSIS — R1012 Left upper quadrant pain: Secondary | ICD-10-CM

## 2023-11-12 DIAGNOSIS — Z860100 Personal history of colon polyps, unspecified: Secondary | ICD-10-CM | POA: Diagnosis not present

## 2023-11-12 DIAGNOSIS — K641 Second degree hemorrhoids: Secondary | ICD-10-CM | POA: Diagnosis not present

## 2023-11-12 DIAGNOSIS — K529 Noninfective gastroenteritis and colitis, unspecified: Secondary | ICD-10-CM | POA: Insufficient documentation

## 2023-11-12 IMAGING — DX DG CHEST 2V
2 series · 2 of 2 positions shown · non-contrast
Comparison: None.

CLINICAL DATA: Cough.  Shortness of breath and chest tightness.

EXAM:
CHEST - 2 VIEW

[chest pa]
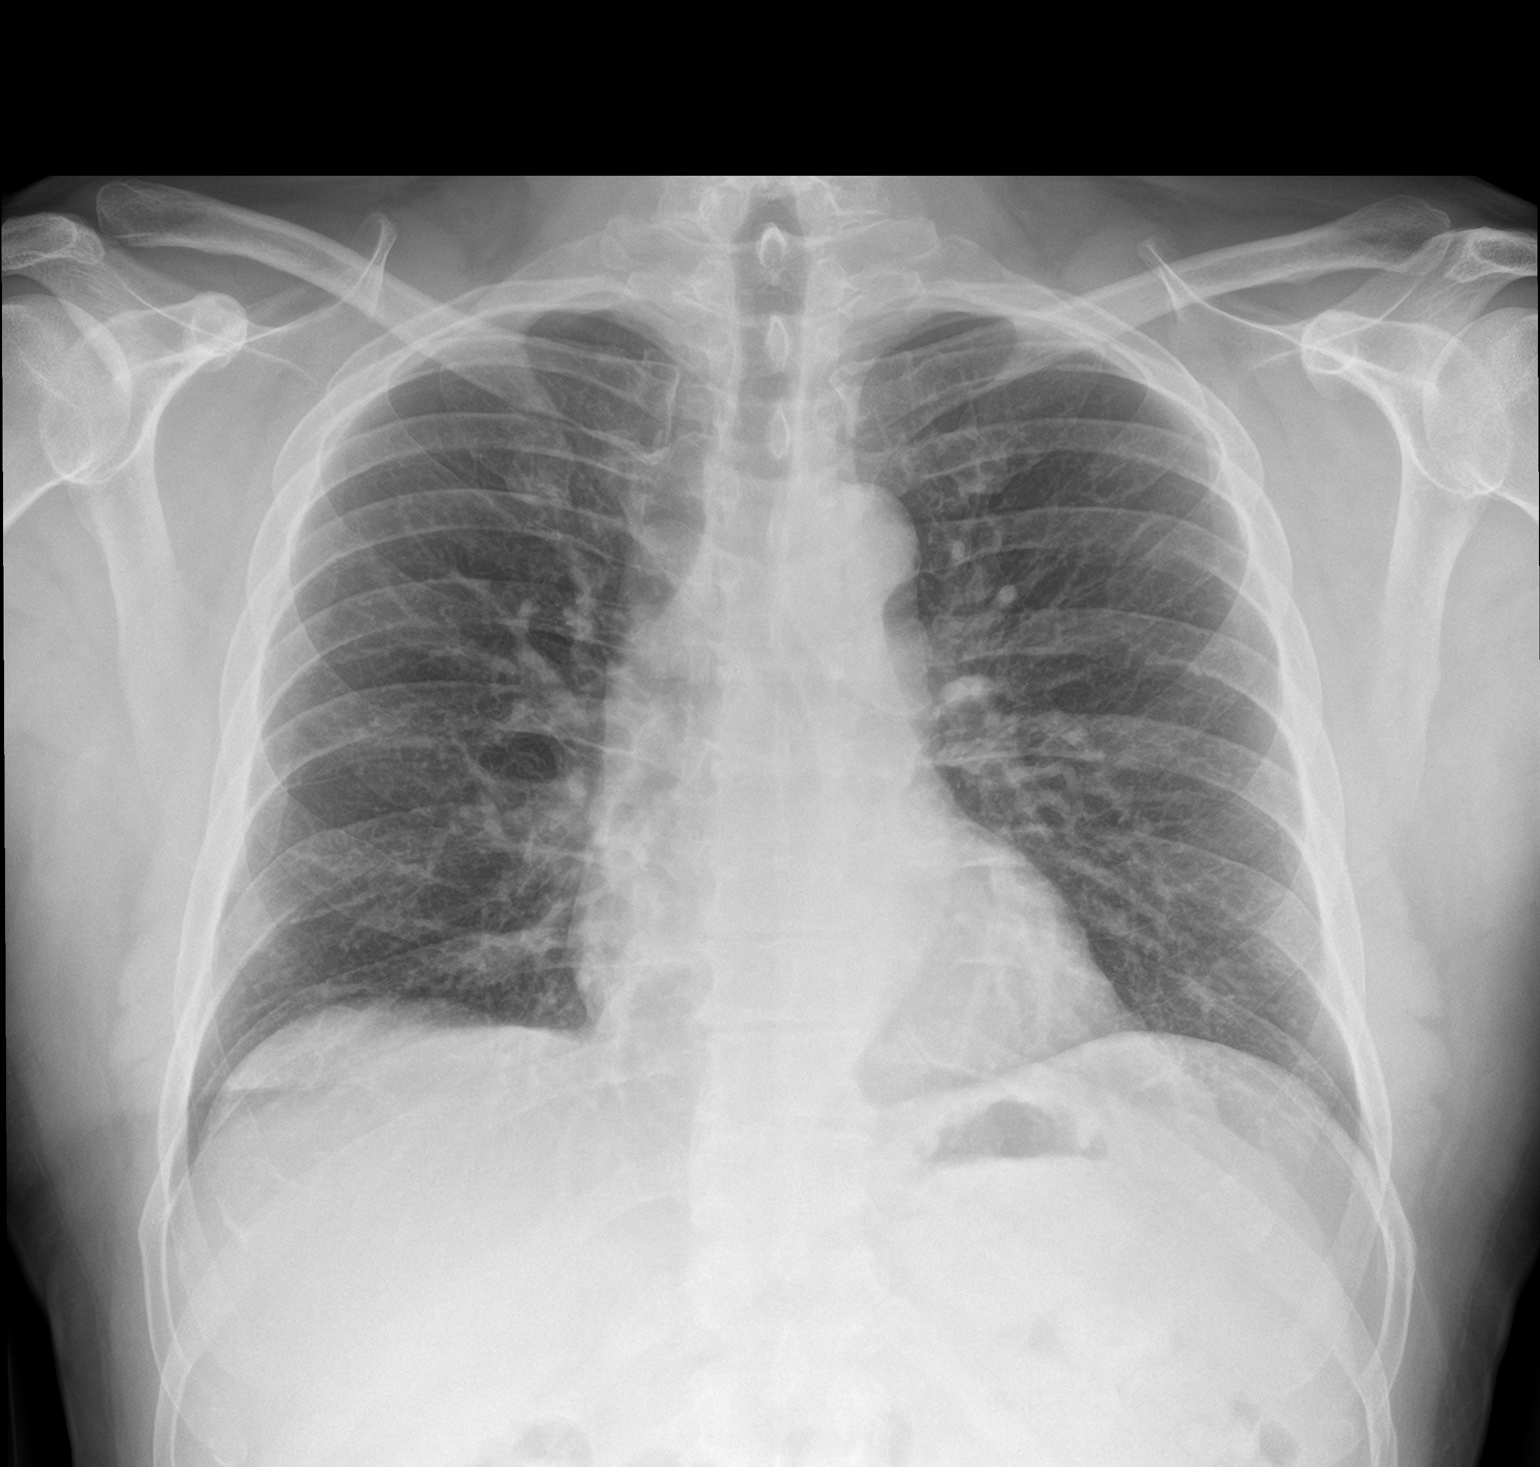

[chest lat]
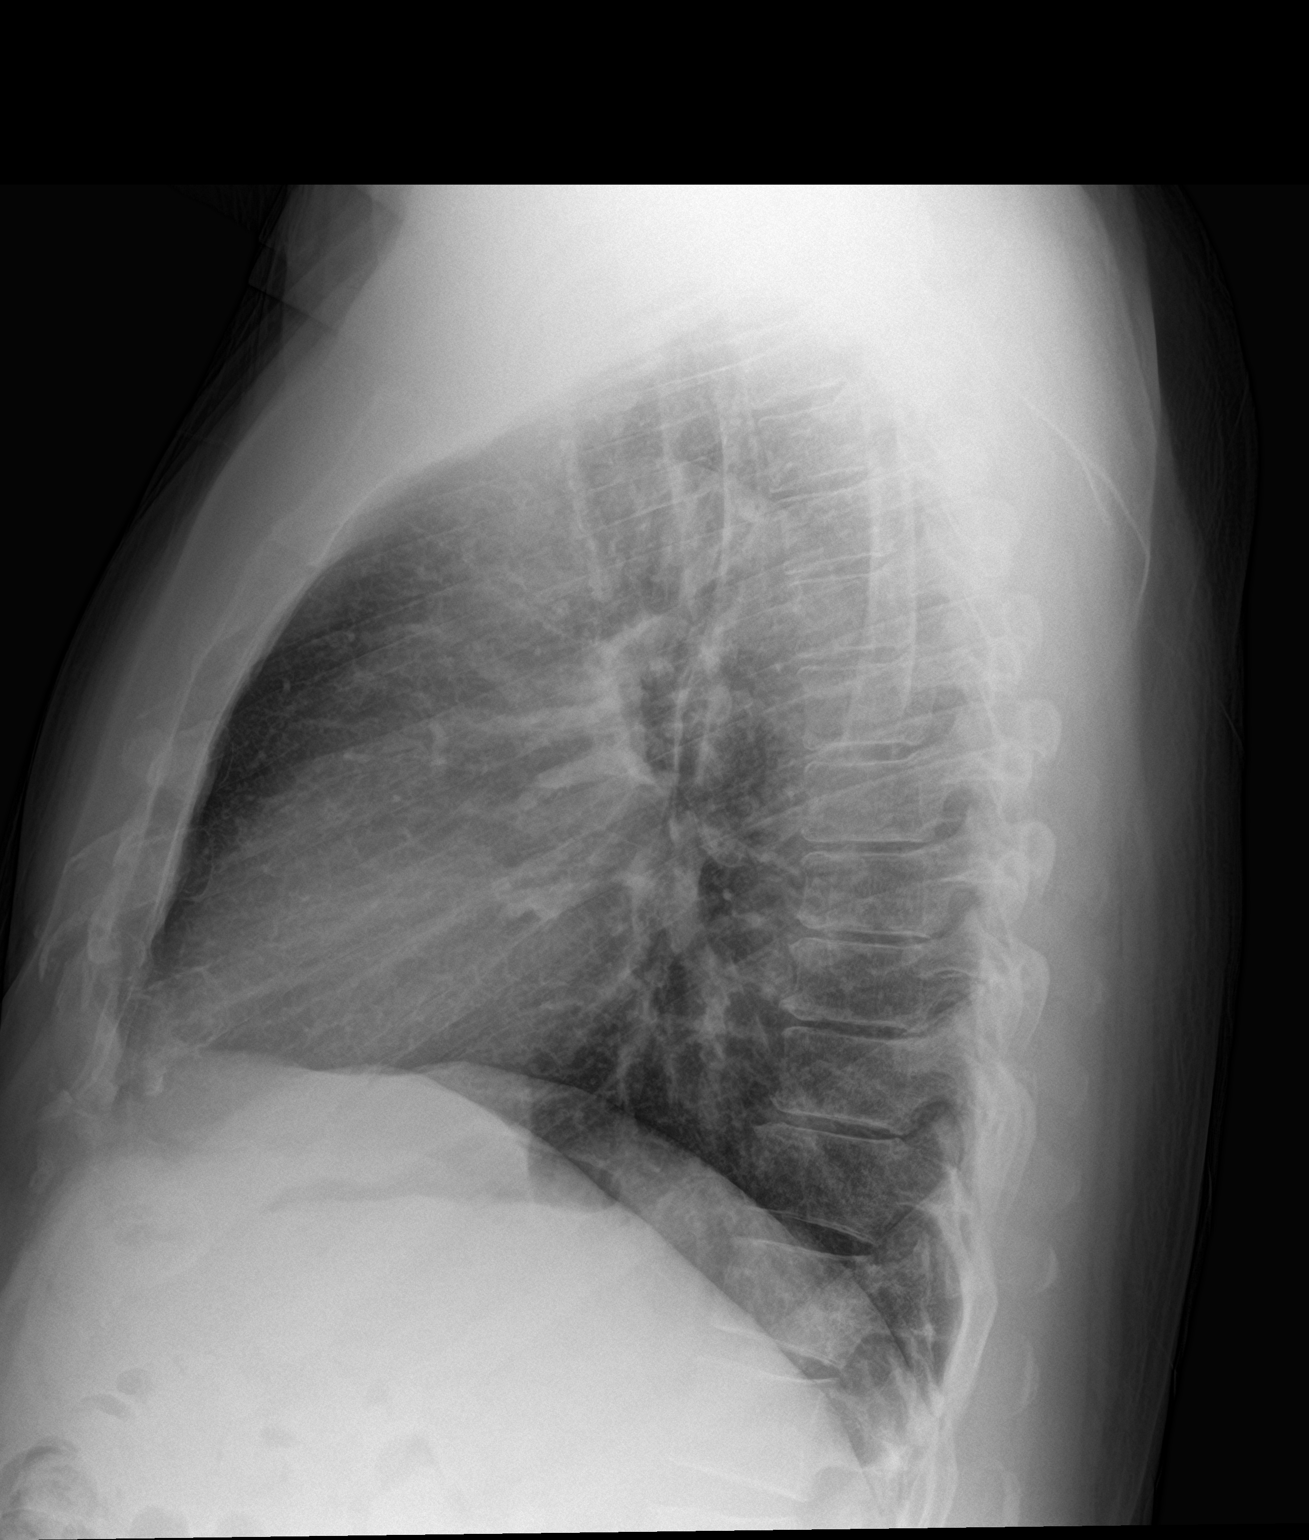

[2 of 2 positions shown; findings below may reference images not displayed]

FINDINGS: The heart size and mediastinal contours are within normal limits.
Both lungs are clear. The visualized skeletal structures are
unremarkable.
IMPRESSION: No active cardiopulmonary disease.

## 2023-11-12 NOTE — Progress Notes (Addendum)
 Gastroenterology Office Note     Primary Care Physician:  Artemisa Bile, MD  Primary Gastroenterologist: Dr. Sammi Crick    Chief Complaint   Chief Complaint  Patient presents with   Follow-up    Patient here today for a follow up visit. He says he continues to have constipation, abdominal pain that comes and goes. He also thinks he has some issues with diverticulitis. He also has rectal bleeding, itching, burning pain with hemorrhoids. He says he has some issues with sweating in the rectal area.      History of Present Illness   [name] is a 58 y.o. male presenting today with a history of abdominal pain, diverticulitis in the past, recent colonoscopy/EGD due to LUQ abdominal pain and changes in bowels. EGD/colonoscopy as noted below.    While taking fiber noted improvement in stool habits. Helped with frequent stool but feels like has some flare ups with abdominal pain. One day out of 10 may have more sluggish stool. States he has more frequent stools not necessarily diarrhea. May go several times before done.  Red meat will cause left-sided pain. Pork will cause pain as well. BM will bring relief.  Stool will be small pieces, flaky, sometimes soft pieces. Has rectal bleeding, itching, burning. Sometimes prolapsing. Grade 2 by description. Underwear wet at times. Multiple wiping to get clean.        EGD March 2025 1 cm hiatal hernia.                           - Normal stomach.                           - Normal examined duodenum. Biopsied.  TCS March 2025: Hemorrhoids found on perianal exam.                           - The examined portion of the ileum was normal.                           - Two 3 to 5 mm polyps in the transverse colon and                            in the ascending colon, removed with a cold snare.                            Resected and retrieved.                           - One 10 mm polyp in the transverse colon, removed                             with a cold snare. Resected and retrieved via EMR.                           - One 3 mm polyp in the descending colon, removed                            with a cold snare. Resected  and retrieved.                           - Diverticulosis in the sigmoid colon and in the                            descending colon. Biopsied.                           - Non-bleeding internal hemorrhoids. 3 year surveillance (3 sessile serrated polyps, one tubular adenoma). Negative microscopic colitis.   Negagtive celiac biopsies  Negative celiac panel.     Past Medical History:  Diagnosis Date   Diverticulitis    Low serum testosterone  level 10/17/2011   tx. injections weekly    Past Surgical History:  Procedure Laterality Date   BUNIONECTOMY  10/22/2011   Procedure: BUNIONECTOMY;  Surgeon: Verlinda Gloss, MD;  Location: [facility];  Service: Orthopedics;  Laterality: Right;   COLONOSCOPY WITH PROPOFOL  N/A 08/20/2023   Procedure: COLONOSCOPY WITH PROPOFOL ;  Surgeon: Urban Garden, MD;  Location: [facility];  Service: Gastroenterology;  Laterality: N/A;  11:15AM;ASA 1   CYSTOSCOPY WITH URETHRAL DILATATION N/A 10/01/2022   Procedure: CYSTOSCOPY WITH OPTILIME BALLOON URETHRAL DILATATION;  Surgeon: Homero Luster, MD;  Location: [facility];  Service: Urology;  Laterality: N/A;  45 MINS FOR CASE   ESOPHAGOGASTRODUODENOSCOPY (EGD) WITH PROPOFOL  N/A 08/20/2023   Procedure: ESOPHAGOGASTRODUODENOSCOPY (EGD) WITH PROPOFOL ;  Surgeon: Urban Garden, MD;  [facility];  Service: Gastroenterology;  Laterality: N/A;  11:15AM;ASA 1   HERNIA REPAIR  10-17-11   '00- RIH   MASS EXCISION  10/22/2011   Procedure: EXCISION MASS;  Surgeon: Verlinda Gloss, MD;  Location: [facility];  Service: Orthopedics;  Laterality: Right;  Excision of a Pump Bump Right Foot Samuel Crock Bunionectomy   POLYPECTOMY  08/20/2023   Procedure: POLYPECTOMY;  Surgeon: Urban Garden, MD;  [facility]: [facility];   Service: Gastroenterology;;   Daryle Eon  08/20/2023   Procedure: Daryle Eon;  Surgeon: Umberto Ganong, Bearl Limes, MD;  Location: [facility];  Service: Gastroenterology;;   WISDOM TOOTH EXTRACTION  10-17-11   extracted as teenager    Current Outpatient Medications  Medication Sig Dispense Refill   Ascorbic Acid (VITAMIN C PO) Take 1 tablet by mouth daily.     atorvastatin (LIPITOR) 20 MG tablet Take 20 mg by mouth at bedtime.     Cholecalciferol (VITAMIN D3 PO) Take 1 tablet by mouth daily.     hydrocortisone  (ANUSOL -HC) 25 MG suppository Place 1 suppository (25 mg total) rectally 2 (two) times daily. Use if recurrent frequent rectal bleeding 12 suppository 0   ibuprofen  (ADVIL ,MOTRIN ) 200 MG tablet Take 400 mg by mouth every 6 (six) hours as needed for moderate pain.     Omega-3 Fatty Acids (OMEGA 3 PO) Take 1 capsule by mouth daily.     temazepam  (RESTORIL ) 15 MG capsule Take 15 mg by mouth at bedtime as needed for sleep.     No current facility-administered medications for this visit.    Allergies as of 11/12/2023   (No Known Allergies)    Family History  Problem Relation Age of Onset   Diabetes Mother        borderline   Atrial fibrillation Father    Diabetes Maternal Grandmother     Social History   Socioeconomic History  Marital status: Married    Spouse name: Not on file   Number of children: Not on file   Years of education: Not on file   Highest education level: Not on file  Occupational History   Not on file  Tobacco Use   Smoking status: Former    Current packs/day: 0.00    Types: Cigarettes    Quit date: 10/16/2000    Years since quitting: 23.0   Smokeless tobacco: Never  Vaping Use   Vaping status: Never Used  Substance and Sexual Activity   Alcohol use: Yes    Comment: 1-2 days per week 1-2 drinks   Drug use: Not Currently    Types: Marijuana    Comment: 1-2 times per month   Sexual activity: Yes  Other Topics Concern   Not on file  Social  History Narrative   Not on file   Social Drivers of Health   Financial Resource Strain: Not on file  Food Insecurity: Not on file  Transportation Needs: Not on file  Physical Activity: Not on file  Stress: Not on file  Social Connections: Unknown (10/29/2021)   Received from [facility]   Social Network    Social Network: Not on file  Intimate Partner Violence: Unknown (09/20/2021)   Received from [facility], [facility]   HITS    Physically Hurt: Not on file    Insult or Talk Down To: Not on file    Threaten Physical Harm: Not on file    Scream or Curse: Not on file     Review of Systems   Gen: Denies any fever, chills, fatigue, weight loss, lack of appetite.  CV: Denies chest pain, heart palpitations, peripheral edema, syncope.  Resp: Denies shortness of breath at rest or with exertion. Denies wheezing or cough.  GI: Denies dysphagia or odynophagia. Denies jaundice, hematemesis, fecal incontinence. GU : Denies urinary burning, urinary frequency, urinary hesitancy MS: Denies joint pain, muscle weakness, cramps, or limitation of movement.  Derm: Denies rash, itching, dry skin Psych: Denies depression, anxiety, memory loss, and confusion Heme: Denies bruising, bleeding, and enlarged lymph nodes.   Physical Exam   BP (!) 151/86 (BP Location: Left Arm, Patient Position: Sitting, Cuff Size: Large)   Pulse (!) 54   Temp (!) 97.1 F (36.2 C) (Temporal)   Ht 5' 11.5" (1.816 m)   Wt 186 lb 11.2 oz (84.7 kg)   BMI 25.68 kg/m  General:   Alert and oriented. Pleasant and cooperative. Well-nourished and well-developed.  Head:  Normocephalic and atraumatic. Eyes:  Without icterus Abdomen:  +BS, soft, non-tender and non-distended. No HSM noted. No guarding or rebound. No masses appreciated.  Rectal:  Deferred  Msk:  Symmetrical without gross deformities. Normal posture. Extremities:  Without edema. Neurologic:  Alert and  oriented x4;  grossly normal  neurologically. Skin:  Intact without significant lesions or rashes. Psych:  Alert and cooperative. Normal mood and affect.   Assessment   Ayinde Swim Overbay is a 58 y.o. male presenting today with a history of abdominal pain, diverticulitis in the past, recent colonoscopy/EGD due to LUQ abdominal pain and changes in bowels as noted above, adenomas on recent colonoscopy, returning with symptomatic hemorrhoids.   Grade 2 internal hemorrhoids by description. Excellent banding candidate. Discussed this in detail today. Baseline bowel habits with frequent, unproductive stools, much improved with taking supplemental fiber daily.   LUQ abdominal pain: with red meat and pork. EGD overall unrevealing. Negative celiac panel  and biopsies. Will check alpha gal panel.  Hx of multiple polyps and one of these 10 mm requiring EMR. Surveillance in 2028. Aaron Aas      PLAN    Continue fiber supplementation as this has been helpful Alpha gal panel Return for hemorrhoid banding in near future   Delman Ferns, PhD, ANP-BC [facility] Gastroenterology   I have reviewed the note and agree with the APP's assessment as described in this progress note  Samantha Cress, MD Gastroenterology and Hepatology [facility] Gastroenterology

## 2023-11-12 NOTE — Patient Instructions (Signed)
 I have ordered the alpha gal panel to have done at [facility].  I will see you in the near future for hemorrhoid banding!  It was a pleasure to see you today. I want to create trusting relationships with patients and provide genuine, compassionate, and quality care. I truly value your feedback, so please be on the lookout for a survey regarding your visit with me today. I appreciate your time in completing this!         Delman Ferns, PhD, ANP-BC [facility] Gastroenterology

## 2023-11-14 DIAGNOSIS — R1030 Lower abdominal pain, unspecified: Secondary | ICD-10-CM | POA: Diagnosis not present

## 2023-11-14 DIAGNOSIS — Z8619 Personal history of other infectious and parasitic diseases: Secondary | ICD-10-CM | POA: Diagnosis not present

## 2023-11-14 DIAGNOSIS — K529 Noninfective gastroenteritis and colitis, unspecified: Secondary | ICD-10-CM | POA: Diagnosis not present

## 2023-11-17 ENCOUNTER — Ambulatory Visit: Payer: Self-pay | Admitting: Gastroenterology

## 2023-11-17 LAB — ALPHA-GAL PANEL
Allergen Lamb IgE: <0.1 kU/L
Beef IgE: <0.1 kU/L
IgE (Immunoglobulin E), Serum: 22 [IU]/mL (ref 6–495)
O215-IgE Alpha-Gal: <0.1 kU/L
Pork IgE: <0.1 kU/L

## 2023-11-26 ENCOUNTER — Ambulatory Visit (INDEPENDENT_AMBULATORY_CARE_PROVIDER_SITE_OTHER): Admitting: Gastroenterology

## 2023-11-26 ENCOUNTER — Encounter (INDEPENDENT_AMBULATORY_CARE_PROVIDER_SITE_OTHER): Payer: Self-pay | Admitting: Gastroenterology

## 2023-11-26 ENCOUNTER — Encounter: Payer: Self-pay | Admitting: Gastroenterology

## 2023-11-26 VITALS — BP 121/78 | HR 48 | Temp 97.7°F | Ht 71.0 in | Wt 187.5 lb

## 2023-11-26 DIAGNOSIS — K641 Second degree hemorrhoids: Secondary | ICD-10-CM

## 2023-11-26 NOTE — Progress Notes (Signed)
 Left lateral     [facility] BANDING PROCEDURE NOTE  [name] is a 58 y.o. male presenting today for consideration of hemorrhoid banding. Last colonoscopy March 2025 with hemorrhoids, polyps, and 3 year surveillance recommended due to sessile serrated and polyps and one tubular adenoma. Notes rectal itching, bleeding, burning, and prolapse.    The patient presents with symptomatic grade 2 hemorrhoids, unresponsive to maximal medical therapy, requesting rubber band ligation of his hemorrhoidal disease. All risks, benefits, and alternative forms of therapy were described and informed consent was obtained.   The decision was made to band the left lateral internal hemorrhoid, and the [facility] O'Regan System was used to perform band ligation without complication. Digital anorectal examination was then performed to assure proper positioning of the band, and to adjust the banded tissue as required. The patient was discharged home without pain or other issues. Dietary and behavioral recommendations were given, along with follow-up instructions. The patient will return in several weeks for followup and possible additional banding as required.  No complications were encountered and the patient tolerated the procedure well.   Delman Ferns, PhD, ANP-BC [facility] Gastroenterology

## 2023-11-26 NOTE — Patient Instructions (Signed)
  Please avoid straining.  You should limit your toilet time to 2-3 minutes at the most.   I recommend continuing fiber.   Please call me with any concerns or issues!  I will see you in follow-up for additional banding in several weeks.    I enjoyed seeing you again today! I value our relationship and want to provide genuine, compassionate, and quality care. You may receive a survey regarding your visit with me, and I welcome your feedback! Thanks so much for taking the time to complete this. I look forward to seeing you again.      Delman Ferns, PhD, ANP-BC [facility] Gastroenterology

## 2023-12-04 ENCOUNTER — Telehealth: Payer: Self-pay

## 2023-12-04 NOTE — Telephone Encounter (Signed)
 Returned the pt's call from this am, no ans. LMOVM for the pt to return call.

## 2023-12-23 ENCOUNTER — Ambulatory Visit (INDEPENDENT_AMBULATORY_CARE_PROVIDER_SITE_OTHER): Admitting: Gastroenterology

## 2023-12-23 ENCOUNTER — Encounter: Payer: Self-pay | Admitting: Gastroenterology

## 2023-12-23 VITALS — BP 136/80 | HR 56 | Temp 97.1°F | Ht 71.0 in | Wt 191.4 lb

## 2023-12-23 DIAGNOSIS — K641 Second degree hemorrhoids: Secondary | ICD-10-CM | POA: Diagnosis not present

## 2023-12-23 NOTE — Patient Instructions (Signed)
 Continue the high fiber diet!  We will see you back in about 2 weeks!  Please call or send mychart message with any concerns!  I enjoyed seeing you again today! I value our relationship and want to provide genuine, compassionate, and quality care. You may receive a survey regarding your visit with me, and I welcome your feedback! Thanks so much for taking the time to complete this. I look forward to seeing you again.      Therisa MICAEL Stager, PhD, ANP-BC [facility] Gastroenterology

## 2023-12-23 NOTE — Progress Notes (Signed)
    [facility] BANDING PROCEDURE NOTE  [name] is a 58 y.o. male presenting today for consideration of hemorrhoid banding. Last colonoscopy colonoscopy March 2025 with hemorrhoids, polyps, and 3 year surveillance recommended due to sessile serrated and polyps and one tubular adenoma. He has had left lateral banding thus far. He notes 3-4 days after banding that his hemorrhoids flared. He has urgency with BM but no rectal pain. Also had interesting sensation of right buttock discomfort about a week later but now resolved.    The patient presents with symptomatic grade 2 hemorrhoids, unresponsive to maximal medical therapy, requesting rubber band ligation of his hemorrhoidal disease. All risks, benefits, and alternative forms of therapy were described and informed consent was obtained.   The decision was made to band the right anterior internal hemorrhoid, and the [facility] O'Regan System was used to perform band ligation without complication. Digital anorectal examination was then performed to assure proper positioning of the band, and to adjust the banded tissue as required. The patient was discharged home without pain or other issues. Dietary and behavioral recommendations were given, along with follow-up instructions. The patient will return in several weeks for followup and possible additional banding as required.  No complications were encountered and the patient tolerated the procedure well.   Therisa MICAEL Stager, PhD, ANP-BC [facility] Gastroenterology

## 2024-01-07 ENCOUNTER — Ambulatory Visit (INDEPENDENT_AMBULATORY_CARE_PROVIDER_SITE_OTHER): Admitting: Gastroenterology

## 2024-01-07 ENCOUNTER — Encounter: Payer: Self-pay | Admitting: Gastroenterology

## 2024-01-07 VITALS — BP 130/75 | HR 56 | Temp 97.1°F | Ht 70.0 in | Wt 190.4 lb

## 2024-01-07 DIAGNOSIS — K641 Second degree hemorrhoids: Secondary | ICD-10-CM

## 2024-01-07 NOTE — Progress Notes (Signed)
       [facility] BANDING PROCEDURE NOTE  [name] is a 58 y.o. male presenting today for consideration of hemorrhoid banding. Last colonoscopy March 2025 with hemorrhoids, polyps, and 3 year surveillance recommended due to sessile serrated and polyps and one tubular adenoma. He has had left lateral banding and right anterior. Overall, he has had improvement in fecal soiling, seepage, itching.   The patient presents with symptomatic grade 2 hemorrhoids, unresponsive to maximal medical therapy, requesting rubber band ligation of his hemorrhoidal disease. All risks, benefits, and alternative forms of therapy were described and informed consent was obtained.   The decision was made to band the right posterior internal hemorrhoid initially, but he had some discomfort despite adequate ligator placement. I then elected to band neutrally, and the [facility] O'Regan System was used to perform band ligation without complication. This resulted in a large amount of tissue again in the right anterior position. Digital anorectal examination was then performed to assure proper positioning of the band, and to adjust the banded tissue as required. The patient was discharged home without pain or other issues. Dietary and behavioral recommendations were given, along with follow-up instructions. The patient will return in 4-6 weeks for follow-up. If doing well, he can cancel this appointment. He may need neutral banding.   No complications were encountered and the patient tolerated the procedure well.   Therisa MICAEL Stager, PhD, ANP-BC [facility] Gastroenterology

## 2024-01-07 NOTE — Patient Instructions (Signed)
  Please avoid straining.  You should limit your toilet time to 2-3 minutes at the most.   I recommend Benefiber 2 teaspoons each morning in the beverage of your choice!  Please call me with any concerns or issues!  I will see you in 4-6 weeks. You can cancel this appointment if you are doing well!  I enjoyed seeing you again today! I value our relationship and want to provide genuine, compassionate, and quality care. You may receive a survey regarding your visit with me, and I welcome your feedback! Thanks so much for taking the time to complete this. I look forward to seeing you again.      Therisa MICAEL Stager, PhD, ANP-BC [facility] Gastroenterology

## 2024-01-15 NOTE — Telephone Encounter (Signed)
 [name]: Can we see if patient can be seen for an appt next week? I know I don't have any openings, but he will need a rectal exam. I believe he has an anal fissure.

## 2024-01-20 ENCOUNTER — Encounter (HOSPITAL_COMMUNITY): Payer: Self-pay | Admitting: *Deleted

## 2024-01-20 ENCOUNTER — Emergency Department (HOSPITAL_COMMUNITY)

## 2024-01-20 ENCOUNTER — Other Ambulatory Visit: Payer: Self-pay

## 2024-01-20 ENCOUNTER — Emergency Department (HOSPITAL_COMMUNITY)
Admission: EM | Admit: 2024-01-20 | Discharge: 2024-01-20 | Disposition: A | Discharge status: Home / Self Care | Attending: Emergency Medicine | Admitting: Emergency Medicine

## 2024-01-20 DIAGNOSIS — R1030 Lower abdominal pain, unspecified: Secondary | ICD-10-CM | POA: Diagnosis not present

## 2024-01-20 DIAGNOSIS — K5792 Diverticulitis of intestine, part unspecified, without perforation or abscess without bleeding: Secondary | ICD-10-CM

## 2024-01-20 DIAGNOSIS — K760 Fatty (change of) liver, not elsewhere classified: Secondary | ICD-10-CM | POA: Diagnosis not present

## 2024-01-20 DIAGNOSIS — K5732 Diverticulitis of large intestine without perforation or abscess without bleeding: Secondary | ICD-10-CM | POA: Insufficient documentation

## 2024-01-20 DIAGNOSIS — R109 Unspecified abdominal pain: Secondary | ICD-10-CM | POA: Diagnosis not present

## 2024-01-20 DIAGNOSIS — K5730 Diverticulosis of large intestine without perforation or abscess without bleeding: Secondary | ICD-10-CM | POA: Diagnosis not present

## 2024-01-20 LAB — COMPREHENSIVE METABOLIC PANEL WITH GFR
ALT: 28 U/L (ref 0–44)
AST: 23 U/L (ref 15–41)
Albumin: 4.2 g/dL (ref 3.5–5.0)
Alkaline Phosphatase: 72 U/L (ref 38–126)
Anion gap: 10 (ref 5–15)
BUN: 15 mg/dL (ref 6–20)
CO2: 23 mmol/L (ref 22–32)
Calcium: 9 mg/dL (ref 8.9–10.3)
Chloride: 104 mmol/L (ref 98–111)
Creatinine, Ser: 0.77 mg/dL (ref 0.61–1.24)
GFR, Estimated: >60 mL/min (ref >60)
Glucose, Bld: 105 mg/dL — ABNORMAL HIGH (ref 70–99)
Potassium: 3.7 mmol/L (ref 3.5–5.1)
Sodium: 137 mmol/L (ref 135–145)
Total Bilirubin: 0.8 mg/dL (ref 0.0–1.2)
Total Protein: 7.1 g/dL (ref 6.5–8.1)

## 2024-01-20 LAB — URINALYSIS, ROUTINE W REFLEX MICROSCOPIC
Bilirubin Urine: NEGATIVE
Glucose, UA: NEGATIVE mg/dL
Hgb urine dipstick: NEGATIVE
Ketones, ur: NEGATIVE mg/dL
Leukocytes,Ua: NEGATIVE
Nitrite: NEGATIVE
Protein, ur: NEGATIVE mg/dL
Specific Gravity, Urine: 1.008 (ref 1.005–1.030)
pH: 7 (ref 5.0–8.0)

## 2024-01-20 LAB — CBC WITH DIFFERENTIAL/PLATELET
Abs Immature Granulocytes: 0.02 K/uL (ref 0.00–0.07)
Basophils Absolute: 0 K/uL (ref 0.0–0.1)
Basophils Relative: 1 %
Eosinophils Absolute: 0.1 K/uL (ref 0.0–0.5)
Eosinophils Relative: 1 %
HCT: 44.5 % (ref 39.0–52.0)
Hemoglobin: 15.2 g/dL (ref 13.0–17.0)
Immature Granulocytes: 0 %
Lymphocytes Relative: 13 %
Lymphs Abs: 1 K/uL (ref 0.7–4.0)
MCH: 29.5 pg (ref 26.0–34.0)
MCHC: 34.2 g/dL (ref 30.0–36.0)
MCV: 86.4 fL (ref 80.0–100.0)
Monocytes Absolute: 0.8 K/uL (ref 0.1–1.0)
Monocytes Relative: 10 %
Neutro Abs: 6 K/uL (ref 1.7–7.7)
Neutrophils Relative %: 75 %
Platelets: 135 K/uL — ABNORMAL LOW (ref 150–400)
RBC: 5.15 MIL/uL (ref 4.22–5.81)
RDW: 12.9 % (ref 11.5–15.5)
WBC: 7.9 K/uL (ref 4.0–10.5)
nRBC: 0 % (ref 0.0–0.2)

## 2024-01-20 MED ORDER — CIPROFLOXACIN HCL 500 MG PO TABS: ORAL_TABLET | ORAL | 0 refills | Status: DC

## 2024-01-20 MED ORDER — CIPROFLOXACIN HCL 500 MG PO TABS: ORAL_TABLET | ORAL | 0 refills | Status: AC

## 2024-01-20 MED ORDER — ONDANSETRON 4 MG PO TBDP: ORAL_TABLET | ORAL | 0 refills | Status: AC

## 2024-01-20 MED ORDER — METRONIDAZOLE 500 MG PO TABS
500.0000 mg | ORAL_TABLET | Freq: Four times a day (QID) | ORAL | 0 refills | Status: AC
Start: 2024-01-20 — End: ?

## 2024-01-20 MED ORDER — IOHEXOL 300 MG/ML  SOLN
100.0000 mL | Freq: Once | INTRAMUSCULAR | Status: AC | PRN
  Administered 2024-01-20: 100 mL via INTRAVENOUS

## 2024-01-20 NOTE — Discharge Instructions (Signed)
 Follow-up with your family doctor or your gastroenterologist next week for recheck.  Return if problems

## 2024-01-20 NOTE — ED Provider Notes (Signed)
 [facility] AT [facility] Provider Note   CSN: [id number] Arrival date & time: 01/20/24  1051     Patient presents with: Abdominal Pain   [name] is a 58 y.o. male.  {Add pertinent medical, surgical, social history, OB history to YEP:67052} Patient has a history of diverticulitis.  He complains of some lower abdominal pain with nausea.   Abdominal Pain      Prior to Admission medications   Medication Sig Start Date End Date Taking? Authorizing Provider  ciprofloxacin  (CIPRO ) 500 MG tablet One po bid 01/20/24  Yes Shaia Porath, MD  metroNIDAZOLE  (FLAGYL ) 500 MG tablet Take 1 tablet (500 mg total) by mouth 4 (four) times daily. 01/20/24  Yes Ted Goodner, MD  ondansetron  (ZOFRAN -ODT) 4 MG disintegrating tablet 4mg  ODT q4 hours prn nausea/vomit 01/20/24  Yes Shandy Vi, MD  Ascorbic Acid (VITAMIN C PO) Take 1 tablet by mouth daily.    [provider]  atorvastatin (LIPITOR) 20 MG tablet Take 20 mg by mouth at bedtime. 09/18/22   [provider]  Cholecalciferol (VITAMIN D3 PO) Take 1 tablet by mouth daily.    [provider]  co-enzyme Q-10 30 MG capsule Take 30 mg by mouth daily.    [provider]  hydrocortisone  (ANUSOL -HC) 25 MG suppository Place 1 suppository (25 mg total) rectally 2 (two) times daily. Use if recurrent frequent rectal bleeding 08/20/23   Eartha Flavors, Toribio, MD  ibuprofen  (ADVIL ,MOTRIN ) 200 MG tablet Take 400 mg by mouth every 6 (six) hours as needed for moderate pain.    [provider]  Omega-3 Fatty Acids (OMEGA 3 PO) Take 1 capsule by mouth daily.    [provider]  temazepam  (RESTORIL ) 15 MG capsule Take 15 mg by mouth at bedtime as needed for sleep.    [provider]    Allergies: Patient has no known allergies.    Review of Systems  Gastrointestinal:  Positive for abdominal pain.    Updated Vital Signs BP 121/80 (BP Location: Right Arm)    Pulse 69   Temp 98.2 F (36.8 C) (Oral)   Resp 16   Ht 5' 10 (1.778 m)   Wt 86.2 kg   SpO2 98%   BMI 27.26 kg/m   Physical Exam  (all labs ordered are listed, but only abnormal results are displayed) Labs Reviewed  URINALYSIS, ROUTINE W REFLEX MICROSCOPIC - Abnormal; Notable for the following components:      Result Value   Color, Urine STRAW (*)    All other components within normal limits  CBC WITH DIFFERENTIAL/PLATELET - Abnormal; Notable for the following components:   Platelets 135 (*)    All other components within normal limits  COMPREHENSIVE METABOLIC PANEL WITH GFR - Abnormal; Notable for the following components:   Glucose, Bld 105 (*)    All other components within normal limits    EKG: None  Radiology: CT ABDOMEN PELVIS W CONTRAST Result Date: 01/20/2024 CLINICAL DATA:  Abdominal pain EXAM: CT ABDOMEN AND PELVIS WITH CONTRAST TECHNIQUE: Multidetector CT imaging of the abdomen and pelvis was performed using the standard protocol following bolus administration of intravenous contrast. RADIATION DOSE REDUCTION: This exam was performed according to the departmental dose-optimization program which includes automated exposure control, adjustment of the mA and/or kV according to patient size and/or use of iterative reconstruction technique. CONTRAST:  OMNIPAQUE  IOHEXOL  300 MG/ML  SOLN COMPARISON:  CT abdomen pelvis May 06, 2022 the in FINDINGS: Lower  chest: No acute abnormality. Hepatobiliary: No focal liver abnormality is seen. Hepatic steatosis. No gallstones, gallbladder wall thickening, or biliary dilatation. Pancreas: Unremarkable. No pancreatic ductal dilatation or surrounding inflammatory changes. Spleen: Normal in size without focal abnormality. Adrenals/Urinary Tract: Adrenal glands are unremarkable. Kidneys are normal, without renal calculi, focal lesion, or hydronephrosis. Bladder is unremarkable. Stomach/Bowel: Colonic diverticulosis. There is short-segment  rectosigmoid wall thickening with associated enhancement and pericolonic fat stranding suggestive of acute diverticulitis (2/61). No pericolonic free air or fluid collection. No bowel obstruction. Small bowel is unremarkable. Stomach is within normal limits. Appendix appears normal. Vascular/Lymphatic: No significant vascular findings are present. No enlarged abdominal or pelvic lymph nodes. Reproductive: Mild prostatomegaly. Other: No abdominal wall hernia or abnormality. No abdominopelvic ascites. Musculoskeletal: No acute or significant osseous findings. IMPRESSION: Rectosigmoid diverticulosis with findings suggestive of acute diverticulitis, similar in location to prior exam. Correlate with clinical findings. Hepatic steatosis. Mild prostatomegaly. Electronically Signed   By: Megan  Zare M.D.   On: 01/20/2024 14:10    {Document cardiac monitor, telemetry assessment procedure when appropriate:32947} Procedures   Medications Ordered in the ED  iohexol  (OMNIPAQUE ) 300 MG/ML solution 100 mL (100 mLs Intravenous Contrast Given 01/20/24 1234)      {Click here for ABCD2, HEART and other calculators REFRESH Note before signing:1}                              Medical Decision Making Amount and/or Complexity of Data Reviewed Labs: ordered. Radiology: ordered.  Risk Prescription drug management.   Patient with mild diverticulitis.  He is placed on Flagyl  and Cipro  and will follow-up with his PCP or his gastroenterologist  {Document critical care time when appropriate  Document review of labs and clinical decision tools ie CHADS2VASC2, etc  Document your independent review of radiology images and any outside records  Document your discussion with family members, caretakers and with consultants  Document social determinants of health affecting pt's care  Document your decision making why or why not admission, treatments were needed:32947:::1}   Final diagnoses:  Diverticulitis    ED Discharge  Orders          Ordered    ciprofloxacin  (CIPRO ) 500 MG tablet        01/20/24 1501    metroNIDAZOLE  (FLAGYL ) 500 MG tablet  4 times daily        01/20/24 1501    ondansetron  (ZOFRAN -ODT) 4 MG disintegrating tablet        01/20/24 1501

## 2024-01-20 NOTE — ED Triage Notes (Signed)
 Pt c/o suprapubic pain for the last couple of days  Pt states he has a hx of diverticulitis

## 2024-01-27 DIAGNOSIS — L210 Seborrhea capitis: Secondary | ICD-10-CM | POA: Diagnosis not present

## 2024-01-27 DIAGNOSIS — K5792 Diverticulitis of intestine, part unspecified, without perforation or abscess without bleeding: Secondary | ICD-10-CM | POA: Diagnosis not present

## 2024-01-28 NOTE — Telephone Encounter (Signed)
 noted

## 2024-01-28 NOTE — Telephone Encounter (Addendum)
 Routing to [employer] as FYI for when she returns. I have already contacted Greig Sharps with patient experience so she can help with a positive outcome for patient.

## 2024-02-03 ENCOUNTER — Encounter (INDEPENDENT_AMBULATORY_CARE_PROVIDER_SITE_OTHER): Payer: Self-pay | Admitting: *Deleted

## 2024-02-04 ENCOUNTER — Ambulatory Visit: Admitting: Gastroenterology

## 2024-03-31 ENCOUNTER — Encounter (INDEPENDENT_AMBULATORY_CARE_PROVIDER_SITE_OTHER): Payer: Self-pay | Admitting: Gastroenterology

## 2024-04-08 ENCOUNTER — Emergency Department (HOSPITAL_COMMUNITY)
Admission: EM | Admit: 2024-04-08 | Discharge: 2024-04-09 | Disposition: A | Discharge status: Home / Self Care | Attending: Emergency Medicine | Admitting: Emergency Medicine

## 2024-04-08 ENCOUNTER — Other Ambulatory Visit: Payer: Self-pay

## 2024-04-08 ENCOUNTER — Encounter (HOSPITAL_COMMUNITY): Payer: Self-pay

## 2024-04-08 DIAGNOSIS — K5732 Diverticulitis of large intestine without perforation or abscess without bleeding: Secondary | ICD-10-CM | POA: Insufficient documentation

## 2024-04-08 DIAGNOSIS — K5792 Diverticulitis of intestine, part unspecified, without perforation or abscess without bleeding: Secondary | ICD-10-CM | POA: Diagnosis not present

## 2024-04-08 DIAGNOSIS — R1032 Left lower quadrant pain: Secondary | ICD-10-CM | POA: Diagnosis not present

## 2024-04-08 DIAGNOSIS — N3289 Other specified disorders of bladder: Secondary | ICD-10-CM | POA: Diagnosis not present

## 2024-04-08 NOTE — ED Triage Notes (Signed)
 Pov from home cc of diverticulitis flare up. Since yesterday. Called pcp they sent in amoxicillin   but it isnt helping .

## 2024-04-09 ENCOUNTER — Emergency Department (HOSPITAL_COMMUNITY)

## 2024-04-09 DIAGNOSIS — K5732 Diverticulitis of large intestine without perforation or abscess without bleeding: Secondary | ICD-10-CM | POA: Diagnosis not present

## 2024-04-09 DIAGNOSIS — R1032 Left lower quadrant pain: Secondary | ICD-10-CM | POA: Diagnosis not present

## 2024-04-09 DIAGNOSIS — N3289 Other specified disorders of bladder: Secondary | ICD-10-CM | POA: Diagnosis not present

## 2024-04-09 LAB — CBC WITH DIFFERENTIAL/PLATELET
Abs Immature Granulocytes: 0.02 K/uL (ref 0.00–0.07)
Basophils Absolute: 0 K/uL (ref 0.0–0.1)
Basophils Relative: 1 %
Eosinophils Absolute: 0.1 K/uL (ref 0.0–0.5)
Eosinophils Relative: 1 %
HCT: 46.5 % (ref 39.0–52.0)
Hemoglobin: 15.6 g/dL (ref 13.0–17.0)
Immature Granulocytes: 0 %
Lymphocytes Relative: 16 %
Lymphs Abs: 1.4 K/uL (ref 0.7–4.0)
MCH: 29.2 pg (ref 26.0–34.0)
MCHC: 33.5 g/dL (ref 30.0–36.0)
MCV: 86.9 fL (ref 80.0–100.0)
Monocytes Absolute: 0.8 K/uL (ref 0.1–1.0)
Monocytes Relative: 10 %
Neutro Abs: 6.2 K/uL (ref 1.7–7.7)
Neutrophils Relative %: 72 %
Platelets: 157 K/uL (ref 150–400)
RBC: 5.35 MIL/uL (ref 4.22–5.81)
RDW: 13.5 % (ref 11.5–15.5)
WBC: 8.5 K/uL (ref 4.0–10.5)
nRBC: 0 % (ref 0.0–0.2)

## 2024-04-09 LAB — COMPREHENSIVE METABOLIC PANEL WITH GFR
ALT: 33 U/L (ref 0–44)
AST: 27 U/L (ref 15–41)
Albumin: 4.8 g/dL (ref 3.5–5.0)
Alkaline Phosphatase: 74 U/L (ref 38–126)
Anion gap: 11 (ref 5–15)
BUN: 11 mg/dL (ref 6–20)
CO2: 28 mmol/L (ref 22–32)
Calcium: 9.4 mg/dL (ref 8.9–10.3)
Chloride: 100 mmol/L (ref 98–111)
Creatinine, Ser: 1.01 mg/dL (ref 0.61–1.24)
GFR, Estimated: >60 mL/min (ref >60)
Glucose, Bld: 141 mg/dL — ABNORMAL HIGH (ref 70–99)
Potassium: 4.1 mmol/L (ref 3.5–5.1)
Sodium: 139 mmol/L (ref 135–145)
Total Bilirubin: 0.9 mg/dL (ref 0.0–1.2)
Total Protein: 7.9 g/dL (ref 6.5–8.1)

## 2024-04-09 LAB — LIPASE, BLOOD: Lipase: 30 U/L (ref 11–51)

## 2024-04-09 MED ORDER — IOHEXOL 300 MG/ML  SOLN
100.0000 mL | Freq: Once | INTRAMUSCULAR | Status: AC | PRN
  Administered 2024-04-09: 100 mL via INTRAVENOUS

## 2024-04-09 MED ORDER — HYDROCODONE-ACETAMINOPHEN 5-325MG PREPACK (~~LOC~~: ORAL_TABLET | ORAL | 0 refills | Status: AC

## 2024-04-09 NOTE — ED Provider Notes (Signed)
 [facility] AT [facility]  Provider Note  CSN: [id number] Arrival date & time: 04/08/24 2255  History Chief Complaint  Patient presents with   Abdominal Pain    [name] is a 58 y.o. male with history of diverticulitis began having some LLQ abdominal pain earlier this week on 10/19. Called his PCP who sent in Rx for augmentin . He has had 48 hours of Abx without much improvement in symptoms and tonight began having some chills and temp up to 100F. He notes he had a flu vaccine earlier this week as well, but he was concerned about progressive infection and so he came to the ED for evaluation.    Home Medications Prior to Admission medications   Medication Sig Start Date End Date Taking? Authorizing Provider  HYDROcodone -acetaminophen  (VICODIN) 5-325 mg TABS tablet Take one tablet every 4-5 hours as needed for pain 04/09/24  Yes Roselyn Carlin NOVAK, MD  Ascorbic Acid (VITAMIN C PO) Take 1 tablet by mouth daily.    [provider]  atorvastatin (LIPITOR) 20 MG tablet Take 20 mg by mouth at bedtime. 09/18/22   [provider]  Cholecalciferol (VITAMIN D3 PO) Take 1 tablet by mouth daily.    [provider]  ciprofloxacin  (CIPRO ) 500 MG tablet One po bid 01/20/24   Zammit, Joseph, MD  co-enzyme Q-10 30 MG capsule Take 30 mg by mouth daily.    [provider]  hydrocortisone  (ANUSOL -HC) 25 MG suppository Place 1 suppository (25 mg total) rectally 2 (two) times daily. Use if recurrent frequent rectal bleeding 08/20/23   Eartha Flavors, Toribio, MD  ibuprofen  (ADVIL ,MOTRIN ) 200 MG tablet Take 400 mg by mouth every 6 (six) hours as needed for moderate pain.    [provider]  metroNIDAZOLE  (FLAGYL ) 500 MG tablet Take 1 tablet (500 mg total) by mouth 4 (four) times daily. 01/20/24   Suzette Pac, MD  Omega-3 Fatty Acids (OMEGA 3 PO) Take 1 capsule by mouth daily.    [provider]  ondansetron  (ZOFRAN -ODT) 4  MG disintegrating tablet 4mg  ODT q4 hours prn nausea/vomit 01/20/24   Zammit, Joseph, MD  temazepam  (RESTORIL ) 15 MG capsule Take 15 mg by mouth at bedtime as needed for sleep.    [provider]     Allergies    Patient has no known allergies.   Review of Systems   Review of Systems Please see HPI for pertinent positives and negatives  Physical Exam BP 128/85 (BP Location: Right Arm)   Pulse 79   Temp 99 F (37.2 C) (Oral)   Resp 18   Ht 5' 10 (1.778 m)   Wt 86.2 kg   SpO2 98%   BMI 27.27 kg/m   Physical Exam Vitals and nursing note reviewed.  HENT:     Head: Normocephalic.     Nose: Nose normal.  Eyes:     Extraocular Movements: Extraocular movements intact.  Pulmonary:     Effort: Pulmonary effort is normal.  Musculoskeletal:        General: Normal range of motion.     Cervical back: Neck supple.  Skin:    Findings: No rash (on exposed skin).  Neurological:     Mental Status: He is alert and oriented to person, place, and time.  Psychiatric:        Mood and Affect: Mood normal.     ED Results / Procedures / Treatments   EKG None  Procedures Procedures  Medications Ordered  in the ED Medications  iohexol  (OMNIPAQUE ) 300 MG/ML solution 100 mL (100 mLs Intravenous Contrast Given 04/09/24 0110)    Initial Impression and Plan  Patient here with suspected diverticulitis, low grade fever at home. Vitals here are reassuring. Labs done in triage show normal CBC, CMP and lipase. I personally viewed the images from radiology studies and agree with radiologist interpretation: CT shows uncomplicated diverticulitis. Offered a switch of Abx and/or pain medications for symptoms control. He would like to complete his Augmentin , but would like some pain medication for the next few days. Recommend PCP follow up, RTED for worsening pain, vomiting, fever or any other concerns.    ED Course       MDM Rules/Calculators/A&P Medical Decision Making Problems  Addressed: Diverticulitis: acute illness or injury  Amount and/or Complexity of Data Reviewed Labs: ordered. Decision-making details documented in ED Course. Radiology: ordered and independent interpretation performed. Decision-making details documented in ED Course.  Risk Prescription drug management.     Final Clinical Impression(s) / ED Diagnoses Final diagnoses:  Diverticulitis    Rx / DC Orders ED Discharge Orders          Ordered    HYDROcodone -acetaminophen  (VICODIN) 5-325 mg TABS tablet        04/09/24 0307             Roselyn Carlin NOVAK, MD 04/09/24 [phone]

## 2024-04-12 DIAGNOSIS — K5792 Diverticulitis of intestine, part unspecified, without perforation or abscess without bleeding: Secondary | ICD-10-CM | POA: Diagnosis not present

## 2024-04-12 MED FILL — Hydrocodone-Acetaminophen Tab 5-325 MG: ORAL | Qty: 6 | Status: AC

## 2024-05-28 DIAGNOSIS — M9902 Segmental and somatic dysfunction of thoracic region: Secondary | ICD-10-CM | POA: Diagnosis not present

## 2024-05-28 DIAGNOSIS — M546 Pain in thoracic spine: Secondary | ICD-10-CM | POA: Diagnosis not present

## 2024-05-28 DIAGNOSIS — M9903 Segmental and somatic dysfunction of lumbar region: Secondary | ICD-10-CM | POA: Diagnosis not present

## 2024-05-28 DIAGNOSIS — M62830 Muscle spasm of back: Secondary | ICD-10-CM | POA: Diagnosis not present

## 2024-06-08 DIAGNOSIS — M9903 Segmental and somatic dysfunction of lumbar region: Secondary | ICD-10-CM | POA: Diagnosis not present

## 2024-06-08 DIAGNOSIS — M62830 Muscle spasm of back: Secondary | ICD-10-CM | POA: Diagnosis not present

## 2024-06-08 DIAGNOSIS — M546 Pain in thoracic spine: Secondary | ICD-10-CM | POA: Diagnosis not present

## 2024-06-08 DIAGNOSIS — M9902 Segmental and somatic dysfunction of thoracic region: Secondary | ICD-10-CM | POA: Diagnosis not present

## 2024-06-14 DIAGNOSIS — M546 Pain in thoracic spine: Secondary | ICD-10-CM | POA: Diagnosis not present

## 2024-06-14 DIAGNOSIS — M62830 Muscle spasm of back: Secondary | ICD-10-CM | POA: Diagnosis not present

## 2024-06-14 DIAGNOSIS — M9902 Segmental and somatic dysfunction of thoracic region: Secondary | ICD-10-CM | POA: Diagnosis not present

## 2024-06-14 DIAGNOSIS — M9903 Segmental and somatic dysfunction of lumbar region: Secondary | ICD-10-CM | POA: Diagnosis not present

## 2024-06-18 ENCOUNTER — Ambulatory Visit (HOSPITAL_COMMUNITY)
Admission: RE | Admit: 2024-06-18 | Discharge: 2024-06-18 | Disposition: A | Discharge status: Home / Self Care | Source: Ambulatory Visit | Attending: Chiropractic Medicine | Admitting: Chiropractic Medicine

## 2024-06-18 ENCOUNTER — Other Ambulatory Visit (HOSPITAL_COMMUNITY): Payer: Self-pay | Admitting: Chiropractic Medicine

## 2024-06-18 DIAGNOSIS — M545 Low back pain, unspecified: Secondary | ICD-10-CM
